# Patient Record
Sex: Female | Born: 1980 | Race: Black or African American | Hispanic: No | Marital: Single | Smoking: Current every day smoker
Health system: Southern US, Community
[De-identification: ages and names within clinical notes are randomized; demographics above are authoritative.]

## PROBLEM LIST (undated history)

## (undated) ENCOUNTER — Inpatient Hospital Stay (HOSPITAL_COMMUNITY): Payer: Self-pay

## (undated) DIAGNOSIS — R011 Cardiac murmur, unspecified: Secondary | ICD-10-CM

---

## 2009-08-08 ENCOUNTER — Emergency Department (HOSPITAL_COMMUNITY): Admission: EM | Admit: 2009-08-08 | Discharge: 2009-08-09 | Payer: Self-pay | Admitting: Emergency Medicine

## 2011-10-10 ENCOUNTER — Encounter (HOSPITAL_COMMUNITY): Payer: Self-pay

## 2011-10-10 ENCOUNTER — Inpatient Hospital Stay (HOSPITAL_COMMUNITY)
Admission: AD | Admit: 2011-10-10 | Discharge: 2011-10-10 | Disposition: A | Discharge status: Home / Self Care | Payer: Medicaid Other | Source: Ambulatory Visit | Attending: Obstetrics & Gynecology | Admitting: Obstetrics & Gynecology

## 2011-10-10 ENCOUNTER — Inpatient Hospital Stay (HOSPITAL_COMMUNITY): Payer: Medicaid Other

## 2011-10-10 DIAGNOSIS — O209 Hemorrhage in early pregnancy, unspecified: Secondary | ICD-10-CM

## 2011-10-10 LAB — CBC
HCT: 36.9 % (ref 36.0–46.0)
MCH: 27.9 pg (ref 26.0–34.0)
MCHC: 33.9 g/dL (ref 30.0–36.0)
MCV: 82.4 fL (ref 78.0–100.0)
Platelets: 212 10*3/uL (ref 150–400)
RDW: 12.4 % (ref 11.5–15.5)
WBC: 4.8 10*3/uL (ref 4.0–10.5)

## 2011-10-10 LAB — URINALYSIS, ROUTINE W REFLEX MICROSCOPIC
Bilirubin Urine: NEGATIVE
Nitrite: NEGATIVE
Specific Gravity, Urine: 1.02 (ref 1.005–1.030)
Urobilinogen, UA: 0.2 mg/dL (ref 0.0–1.0)
pH: 7 (ref 5.0–8.0)

## 2011-10-10 LAB — WET PREP, GENITAL
Clue Cells Wet Prep HPF POC: NONE SEEN
Trich, Wet Prep: NONE SEEN

## 2011-10-10 LAB — URINE MICROSCOPIC-ADD ON

## 2011-10-10 LAB — POCT PREGNANCY, URINE: Preg Test, Ur: POSITIVE — AB

## 2011-10-10 NOTE — MAU Provider Note (Signed)
History     CSN: [id number]  Arrival date and time: 10/10/11 1058   None     Chief Complaint  Patient presents with  . Vaginal Bleeding   HPI 31 y.o. G1P0 at [redacted]w[redacted]d by LMP. + UPT at [facility] last week. Spotting x 2 days, heavier bleeding today, + cramping today. Last intercourse 4 or 5 days ago.    Past Medical History  Diagnosis Date  . No pertinent past medical history     No past surgical history on file.  Family History  Problem Relation Age of Onset  . Hypertension Mother   . Hyperlipidemia Father   . Hypertension Father     History  Substance Use Topics  . Smoking status: Current Everyday Smoker -- 1 years  . Smokeless tobacco: Not on file  . Alcohol Use: No     former use    Allergies: No Known Allergies  Prescriptions prior to admission  Medication Sig Dispense Refill  . ibuprofen (ADVIL,MOTRIN) 200 MG tablet Take 200 mg by mouth every 6 (six) hours as needed.      . Prenatal Vit-Fe Fumarate-FA (PRENATAL MULTIVITAMIN) TABS Take 1 tablet by mouth daily.        Review of Systems  Constitutional: Negative.   Respiratory: Negative.   Cardiovascular: Negative.   Gastrointestinal: Negative for nausea, vomiting, abdominal pain, diarrhea and constipation.  Genitourinary: Negative for dysuria, urgency, frequency, hematuria and flank pain.       Positive for vaginal bleeding and cramping  Musculoskeletal: Negative.   Neurological: Negative.   Psychiatric/Behavioral: Negative.    Physical Exam   Blood pressure 116/66, pulse 60, temperature 97.8 F (36.6 C), temperature source Oral, resp. rate 18, height 5\' 7"  (1.702 m), weight 137 lb 9.6 oz (62.415 kg), last menstrual period 07/13/2011.  Physical Exam  Nursing note and vitals reviewed. Constitutional: She is oriented to person, place, and time. She appears well-developed and well-nourished. No distress.  Cardiovascular: Normal rate.   Respiratory: Effort normal. No respiratory distress.  GI: Soft. She  exhibits no distension and no mass. There is no tenderness. There is no rebound and no guarding.  Genitourinary: There is no tenderness, lesion or injury on the right labia. There is no tenderness, lesion or injury on the left labia. Uterus is not tender. Cervix exhibits no motion tenderness, no discharge and no friability. Right adnexum displays no mass, no tenderness and no fullness. Left adnexum displays no mass, no tenderness and no fullness. There is bleeding (small) around the vagina. No erythema around the vagina. No vaginal discharge found.  Musculoskeletal: Normal range of motion.  Neurological: She is alert and oriented to person, place, and time.  Skin: Skin is warm and dry.  Psychiatric: She has a normal mood and affect.    MAU Course  Procedures  Results for orders placed during the hospital encounter of 10/10/11 (from the past 24 hour(s))  URINALYSIS, ROUTINE W REFLEX MICROSCOPIC     Status: Abnormal   Collection Time   10/10/11 11:26 AM      Component Value Range   Color, Urine YELLOW  YELLOW    APPearance HAZY (*) CLEAR    Specific Gravity, Urine 1.020  1.005 - 1.030    pH 7.0  5.0 - 8.0    Glucose, UA NEGATIVE  NEGATIVE (mg/dL)   Hgb urine dipstick LARGE (*) NEGATIVE    Bilirubin Urine NEGATIVE  NEGATIVE    Ketones, ur NEGATIVE  NEGATIVE (mg/dL)   Protein, ur  NEGATIVE  NEGATIVE (mg/dL)   Urobilinogen, UA 0.2  0.0 - 1.0 (mg/dL)   Nitrite NEGATIVE  NEGATIVE    Leukocytes, UA NEGATIVE  NEGATIVE   URINE MICROSCOPIC-ADD ON     Status: Abnormal   Collection Time   10/10/11 11:26 AM      Component Value Range   Squamous Epithelial / LPF FEW (*) RARE    WBC, UA 0-2  <3 (WBC/hpf)   RBC / HPF 21-50  <3 (RBC/hpf)   Bacteria, UA RARE  RARE   POCT PREGNANCY, URINE     Status: Abnormal   Collection Time   10/10/11 11:33 AM      Component Value Range   Preg Test, Ur POSITIVE (*) NEGATIVE   WET PREP, GENITAL     Status: Abnormal   Collection Time   10/10/11 12:20 PM       Component Value Range   Yeast Wet Prep HPF POC NONE SEEN  NONE SEEN    Trich, Wet Prep NONE SEEN  NONE SEEN    Clue Cells Wet Prep HPF POC NONE SEEN  NONE SEEN    WBC, Wet Prep HPF POC FEW (*) NONE SEEN   CBC     Status: Normal   Collection Time   10/10/11 12:41 PM      Component Value Range   WBC 4.8  4.0 - 10.5 (K/uL)   RBC 4.48  3.87 - 5.11 (MIL/uL)   Hemoglobin 12.5  12.0 - 15.0 (g/dL)   HCT 16.1  09.6 - 04.5 (%)   MCV 82.4  78.0 - 100.0 (fL)   MCH 27.9  26.0 - 34.0 (pg)   MCHC 33.9  30.0 - 36.0 (g/dL)   RDW 40.9  81.1 - 91.4 (%)   Platelets 212  150 - 400 (K/uL)  ABO/RH     Status: Normal   Collection Time   10/10/11 12:41 PM      Component Value Range   ABO/RH(D) B POS    HCG, QUANTITATIVE, PREGNANCY     Status: Abnormal   Collection Time   10/10/11 12:41 PM      Component Value Range   hCG, Beta Chain, Quant, S 8757 (*) <5 (mIU/mL)   US Ob Comp Less 14 Wks  10/10/2011  *RADIOLOGY REPORT*  Clinical Data: Abdominal pain and bleeding and estimated gestational age by last menstrual period equals 12 weeks 5 days.  OBSTETRIC <14 WK ULTRASOUND  Technique:  Transabdominal ultrasound was performed for evaluation of the gestation as well as the maternal uterus and adnexal regions.  Comparison:  None.  Intrauterine gestational sac: Single elongated gestational sac. Yolk sac: Non identified Embryo: Not identified  MSD: 14.5 mm  sixw  2d  Maternal uterus/Adnexae: Right ovary is normal.  The simple cyst within the left ovary measuring 4.3 cm.  The cyst is anechoic and thin-walled.  There multiple fibroids within the uterus which are intramural.  The largest measures 2 cm.  There is trace amount of free fluid.  There is a large subchorionic hemorrhage.  IMPRESSION:  1.  Single elongated intrauterine gestational sac without a yolk sac or embryo.  Differential includes early intrauterine gestation, pseudo sac of ectopic pregnancy, or spontaneous abortion in progress.  2. Estimated gestational age by  mean sac diameter equals 6 weeks 2 days. Of note this is discordant with last menstrual period.  3.  Large subchorionic hemorrhage. 4.  Trace free fluid. 5.  Functional ovarian cysts within the left ovary and  uterine fibroids.  Original Report Authenticated By: Genevive Bi, M.D.   US Ob Transvaginal  10/10/2011  *RADIOLOGY REPORT*  Clinical Data: Abdominal pain and bleeding and estimated gestational age by last menstrual period equals 12 weeks 5 days.  OBSTETRIC <14 WK ULTRASOUND  Technique:  Transabdominal ultrasound was performed for evaluation of the gestation as well as the maternal uterus and adnexal regions.  Comparison:  None.  Intrauterine gestational sac: Single elongated gestational sac. Yolk sac: Non identified Embryo: Not identified  MSD: 14.5 mm  sixw  2d  Maternal uterus/Adnexae: Right ovary is normal.  The simple cyst within the left ovary measuring 4.3 cm.  The cyst is anechoic and thin-walled.  There multiple fibroids within the uterus which are intramural.  The largest measures 2 cm.  There is trace amount of free fluid.  There is a large subchorionic hemorrhage.  IMPRESSION:  1.  Single elongated intrauterine gestational sac without a yolk sac or embryo.  Differential includes early intrauterine gestation, pseudo sac of ectopic pregnancy, or spontaneous abortion in progress.  2. Estimated gestational age by mean sac diameter equals 6 weeks 2 days. Of note this is discordant with last menstrual period.  3.  Large subchorionic hemorrhage. 4.  Trace free fluid. 5.  Functional ovarian cysts within the left ovary and uterine fibroids.  Original Report Authenticated By: Genevive Bi, M.D.    Assessment and Plan  31 y.o. G1P0 with bleeding in early pregnancy F/U 48 hours for repeat quant Rev'd precautions  Richie Bonanno 10/10/2011, 1:42 PM

## 2011-10-10 NOTE — MAU Note (Signed)
Patient has small amount of bright red blood on pad.

## 2011-10-10 NOTE — MAU Note (Signed)
Pt reports having spotting x 2 days and woke up this morning a vaginal bleeding heavier with cramping.

## 2011-10-11 ENCOUNTER — Encounter (HOSPITAL_COMMUNITY): Payer: Self-pay | Admitting: *Deleted

## 2011-10-11 ENCOUNTER — Inpatient Hospital Stay (HOSPITAL_COMMUNITY)
Admission: AD | Admit: 2011-10-11 | Discharge: 2011-10-11 | Disposition: A | Discharge status: Home / Self Care | Payer: Medicaid Other | Source: Ambulatory Visit | Attending: Obstetrics & Gynecology | Admitting: Obstetrics & Gynecology

## 2011-10-11 DIAGNOSIS — O039 Complete or unspecified spontaneous abortion without complication: Secondary | ICD-10-CM | POA: Insufficient documentation

## 2011-10-11 HISTORY — DX: Cardiac murmur, unspecified: R01.1

## 2011-10-11 LAB — CBC
Hemoglobin: 12.9 g/dL (ref 12.0–15.0)
MCH: 27.9 pg (ref 26.0–34.0)
Platelets: 232 10*3/uL (ref 150–400)
RBC: 4.62 MIL/uL (ref 3.87–5.11)
WBC: 6.4 10*3/uL (ref 4.0–10.5)

## 2011-10-11 LAB — HCG, QUANTITATIVE, PREGNANCY: hCG, Beta Chain, Quant, S: 2586 m[IU]/mL — ABNORMAL HIGH (ref <5)

## 2011-10-11 NOTE — MAU Provider Note (Signed)
History     CSN: [id number]  Arrival date and time: 10/11/11 1205   First Provider Initiated Contact with Patient 10/11/11 1314      Chief Complaint  Patient presents with  . Abdominal Cramping   HPI 31 y.o. G1P0000 here with vaginal bleeding, passed clot and tissue this morning. Mild pain. Seen in MAU yesterday, 12 weeks by LMP, u/s showed irregular 6+ week size gestational sac with no yolk sac or fetal pole.    Past Medical History  Diagnosis Date  . Heart murmur     at birth    Past Surgical History  Procedure Date  . No past surgeries     Family History  Problem Relation Age of Onset  . Hypertension Mother   . Hyperlipidemia Father   . Hypertension Father   . Anesthesia problems Neg Hx     History  Substance Use Topics  . Smoking status: Current Everyday Smoker -- 1 years    Types: Cigarettes  . Smokeless tobacco: Never Used   Comment: went from pack a day, to 1 a day with + preg  . Alcohol Use: No     former use    Allergies: No Known Allergies  No prescriptions prior to admission    Review of Systems  Constitutional: Negative.   Respiratory: Negative.   Cardiovascular: Negative.   Gastrointestinal: Negative for nausea, vomiting, abdominal pain, diarrhea and constipation.  Genitourinary: Negative for dysuria, urgency, frequency, hematuria and flank pain.       Positive for vaginal bleeding, mild cramping  Musculoskeletal: Negative.   Neurological: Negative.   Psychiatric/Behavioral: Negative.    Physical Exam   Blood pressure 128/72, pulse 69, temperature 97.5 F (36.4 C), temperature source Oral, resp. rate 16, height 5\' 6"  (1.676 m), weight 139 lb 12.8 oz (63.413 kg), last menstrual period 07/13/2011, SpO2 100.00%.  Physical Exam  Nursing note and vitals reviewed. Constitutional: She is oriented to person, place, and time. She appears well-developed and well-nourished. No distress.  Cardiovascular: Normal rate.   Respiratory: Effort  normal.  Genitourinary: There is bleeding (small) around the vagina.       Cervix slightly open   Musculoskeletal: Normal range of motion.  Neurological: She is alert and oriented to person, place, and time.  Skin: Skin is warm and dry.  Psychiatric: She has a normal mood and affect.   Pt presented to MAU with apparent POC on pad, sent to pathology  MAU Course  Procedures Results for orders placed during the hospital encounter of 10/11/11 (from the past 72 hour(s))  CBC     Status: Normal   Collection Time   10/11/11  1:00 PM      Component Value Range Comment   WBC 6.4  4.0 - 10.5 (K/uL)    RBC 4.62  3.87 - 5.11 (MIL/uL)    Hemoglobin 12.9  12.0 - 15.0 (g/dL)    HCT 40.9  81.1 - 91.4 (%)    MCV 82.9  78.0 - 100.0 (fL)    MCH 27.9  26.0 - 34.0 (pg)    MCHC 33.7  30.0 - 36.0 (g/dL)    RDW 78.2  95.6 - 21.3 (%)    Platelets 232  150 - 400 (K/uL)   HCG, QUANTITATIVE, PREGNANCY     Status: Abnormal   Collection Time   10/11/11  1:00 PM      Component Value Range Comment   hCG, Beta Chain, Quant, S 2586 (*) <5 (mIU/mL)  Quant decreased from 8700 yesterday.    Assessment and Plan  31 y.o. G1P0000 with 6 week SAB Precautions rev'd F/U in clinic in 2 weeks for repeat quant  Shela Esses 10/11/2011, 2:00 PM

## 2011-10-11 NOTE — MAU Note (Signed)
Was to return tomorrow.  When passed a clot with tissue thought she should just come back.

## 2011-10-11 NOTE — MAU Note (Signed)
Patient states she was seen in MAU yesterday. Passed a moderate size clot with some "tissue" last night. Continues to have a little brown to bright red bleeding with wiping. Has lower abdominal cramping.

## 2011-10-25 ENCOUNTER — Other Ambulatory Visit: Payer: Self-pay

## 2011-10-25 DIAGNOSIS — O039 Complete or unspecified spontaneous abortion without complication: Secondary | ICD-10-CM

## 2011-10-25 LAB — HCG, QUANTITATIVE, PREGNANCY: hCG, Beta Chain, Quant, S: 15.6 m[IU]/mL

## 2012-01-19 ENCOUNTER — Encounter (HOSPITAL_COMMUNITY): Payer: Self-pay | Admitting: *Deleted

## 2012-01-19 ENCOUNTER — Inpatient Hospital Stay (HOSPITAL_COMMUNITY): Payer: Medicaid Other

## 2012-01-19 ENCOUNTER — Inpatient Hospital Stay (HOSPITAL_COMMUNITY)
Admission: AD | Admit: 2012-01-19 | Discharge: 2012-01-19 | Disposition: A | Discharge status: Home / Self Care | Payer: Medicaid Other | Source: Ambulatory Visit | Attending: Obstetrics & Gynecology | Admitting: Obstetrics & Gynecology

## 2012-01-19 DIAGNOSIS — O210 Mild hyperemesis gravidarum: Secondary | ICD-10-CM | POA: Insufficient documentation

## 2012-01-19 DIAGNOSIS — Z3490 Encounter for supervision of normal pregnancy, unspecified, unspecified trimester: Secondary | ICD-10-CM

## 2012-01-19 DIAGNOSIS — R197 Diarrhea, unspecified: Secondary | ICD-10-CM | POA: Insufficient documentation

## 2012-01-19 DIAGNOSIS — R109 Unspecified abdominal pain: Secondary | ICD-10-CM | POA: Insufficient documentation

## 2012-01-19 DIAGNOSIS — O219 Vomiting of pregnancy, unspecified: Secondary | ICD-10-CM

## 2012-01-19 DIAGNOSIS — N83209 Unspecified ovarian cyst, unspecified side: Secondary | ICD-10-CM | POA: Insufficient documentation

## 2012-01-19 DIAGNOSIS — O209 Hemorrhage in early pregnancy, unspecified: Secondary | ICD-10-CM | POA: Insufficient documentation

## 2012-01-19 DIAGNOSIS — O34599 Maternal care for other abnormalities of gravid uterus, unspecified trimester: Secondary | ICD-10-CM | POA: Insufficient documentation

## 2012-01-19 LAB — URINALYSIS, ROUTINE W REFLEX MICROSCOPIC
Hgb urine dipstick: NEGATIVE
Nitrite: NEGATIVE
Protein, ur: NEGATIVE mg/dL
Specific Gravity, Urine: 1.025 (ref 1.005–1.030)
Urobilinogen, UA: 0.2 mg/dL (ref 0.0–1.0)

## 2012-01-19 LAB — HCG, QUANTITATIVE, PREGNANCY: hCG, Beta Chain, Quant, S: 81099 m[IU]/mL — ABNORMAL HIGH (ref <5)

## 2012-01-19 LAB — CBC
HCT: 36.4 % (ref 36.0–46.0)
Hemoglobin: 12.7 g/dL (ref 12.0–15.0)
RBC: 4.52 MIL/uL (ref 3.87–5.11)
WBC: 6.1 10*3/uL (ref 4.0–10.5)

## 2012-01-19 LAB — POCT PREGNANCY, URINE: Preg Test, Ur: POSITIVE — AB

## 2012-01-19 LAB — WET PREP, GENITAL
Clue Cells Wet Prep HPF POC: NONE SEEN
Trich, Wet Prep: NONE SEEN

## 2012-01-19 NOTE — MAU Provider Note (Signed)
History     CSN: [id number]  Arrival date and time: 01/19/12 1536   First Provider Initiated Contact with Patient 01/19/12 1626      Chief Complaint  Patient presents with  . Abdominal Pain  . Nausea  . Possible Pregnancy   HPI  Laurie [name] 31 y.o. [redacted]w[redacted]d presents today for +N/V/D since Sunday.  States some recent vaginal discharge.  No odor.  No bleeding.  Denies fever or cough.  Has tried tums and MOM with no relief.  OB History    Grav Para Term Preterm Abortions TAB SAB Ect Mult Living   2 0 0 0 1 0 1 0 0 0       Past Medical History  Diagnosis Date  . Heart murmur     at birth    Past Surgical History  Procedure Date  . No past surgeries     Family History  Problem Relation Age of Onset  . Hypertension Mother   . Hyperlipidemia Father   . Hypertension Father   . Anesthesia problems Neg Hx     History  Substance Use Topics  . Smoking status: Current Everyday Smoker -- 1 years    Types: Cigarettes  . Smokeless tobacco: Never Used   Comment: went from pack a day, to 1 a day with + preg  . Alcohol Use: No     former use    Allergies: No Known Allergies  Prescriptions prior to admission  Medication Sig Dispense Refill  . ibuprofen (ADVIL,MOTRIN) 200 MG tablet Take 400-600 mg by mouth every 6 (six) hours as needed. For pain        Review of Systems  Constitutional: Negative.   HENT: Negative.   Eyes: Negative.   Respiratory: Negative.   Cardiovascular: Negative.   Gastrointestinal: Positive for nausea, vomiting, abdominal pain and diarrhea.       See HPI.  Genitourinary: Negative for dysuria, urgency and frequency.  Musculoskeletal: Negative.   Skin: Negative.   Neurological: Negative.   Endo/Heme/Allergies: Negative.   Psychiatric/Behavioral: Negative.    Physical Exam   Blood pressure 119/80, pulse 63, temperature 98.8 F (37.1 C), temperature source Oral, resp. rate 20, height 5\' 6"  (1.676 m), weight 59.421 kg (131 lb), last  menstrual period 07/13/2011, not currently breastfeeding.  Physical Exam  Constitutional: She is oriented to person, place, and time. She appears well-developed and well-nourished.  HENT:  Head: Normocephalic and atraumatic.  Neck: Normal range of motion. Neck supple.  Cardiovascular: Normal rate, regular rhythm and intact distal pulses.  Exam reveals no gallop.   Murmur heard.      S2 split with click heard.  Patient states has had a murmur since childhood.  Has had no problems with it.  Respiratory: Effort normal and breath sounds normal. No respiratory distress.  GI: Soft. Bowel sounds are normal.  Genitourinary: Uterus normal. Vaginal discharge found.       See below.  Musculoskeletal: Normal range of motion.  Neurological: She is alert and oriented to person, place, and time.  Skin: Skin is warm and dry.  Psychiatric: She has a normal mood and affect. Her behavior is normal.   Speculum exam: Vagina - Small amount of creamy discharge, no odor. Cervix - No contact bleeding. Bimanual exam: Cervix closed. No CMT. Uterus non tender, normal size for dates. Adnexa non tender, no masses bilaterally. GC/Chlam, wet prep done. Chaperone present for exam.     MAU Course  Procedures  MDM Results for orders  placed during the hospital encounter of 01/19/12 (from the past 24 hour(s))  URINALYSIS, ROUTINE W REFLEX MICROSCOPIC     Status: Abnormal   Collection Time   01/19/12  4:00 PM      Component Value Range   Color, Urine YELLOW  YELLOW   APPearance HAZY (*) CLEAR   Specific Gravity, Urine 1.025  1.005 - 1.030   pH 6.0  5.0 - 8.0   Glucose, UA NEGATIVE  NEGATIVE mg/dL   Hgb urine dipstick NEGATIVE  NEGATIVE   Bilirubin Urine NEGATIVE  NEGATIVE   Ketones, ur 15 (*) NEGATIVE mg/dL   Protein, ur NEGATIVE  NEGATIVE mg/dL   Urobilinogen, UA 0.2  0.0 - 1.0 mg/dL   Nitrite NEGATIVE  NEGATIVE   Leukocytes, UA NEGATIVE  NEGATIVE  POCT PREGNANCY, URINE     Status: Abnormal    Collection Time   01/19/12  4:11 PM      Component Value Range   Preg Test, Ur POSITIVE (*) NEGATIVE  CBC     Status: Normal   Collection Time   01/19/12  4:45 PM      Component Value Range   WBC 6.1  4.0 - 10.5 K/uL   RBC 4.52  3.87 - 5.11 MIL/uL   Hemoglobin 12.7  12.0 - 15.0 g/dL   HCT 40.9  81.1 - 91.4 %   MCV 80.5  78.0 - 100.0 fL   MCH 28.1  26.0 - 34.0 pg   MCHC 34.9  30.0 - 36.0 g/dL   RDW 78.2  95.6 - 21.3 %   Platelets 212  150 - 400 K/uL  HCG, QUANTITATIVE, PREGNANCY     Status: Abnormal   Collection Time   01/19/12  4:45 PM      Component Value Range   hCG, Beta Chain, Quant, S 81099 (*) <5 mIU/mL  WET PREP, GENITAL     Status: Abnormal   Collection Time   01/19/12  5:49 PM      Component Value Range   Yeast Wet Prep HPF POC NONE SEEN  NONE SEEN   Trich, Wet Prep NONE SEEN  NONE SEEN   Clue Cells Wet Prep HPF POC NONE SEEN  NONE SEEN   WBC, Wet Prep HPF POC MODERATE (*) NONE SEEN   *RADIOLOGY REPORT*  Clinical Data: Positive urine pregnancy test. Prior miscarriage  several months ago.  OBSTETRIC <14 WK [place] AND TRANSVAGINAL OB US  Technique: Both transabdominal and transvaginal ultrasound  examinations were performed for complete evaluation of the  gestation as well as the maternal uterus, adnexal regions, and  pelvic cul-de-sac.  Findings: Intrauterine gestational sac noted with visualized embryo  and yolk sac. Embryonic cardiac activity is present at 115 beats  per minute, and crown-rump length is 0.7 cm compatible with 6 weeks  4 days gestation.  A small amount of subchorionic hemorrhage is visible.  Right ovary measures the right ovary appears normal. Left ovary  measures 4.8 x 4.4 x 4.4 cm and contains a 3.7 x 4.0 x 4.1 cm  mildly complex cyst with slightly irregular margins. Trace free  pelvic fluid observed.  IMPRESSION:  1. Single living anterior pregnancy measuring at 6 weeks forties  gestation. Small amount of subchorionic hemorrhage.  2. 4 cm cyst  in the left ovary, slightly complex. Trace amount of  maternal free pelvic fluid.  Original Report Authenticated By: Dellia Cloud, M.D.      Patient is B+.    Assessment and Plan  Assessment:  Pregnancy L Ovarian Cyst Small Subchorionic Hemorrhage  Plan:  Your pregnancy test is positive.  No smoking, no drugs, no alcohol.  Take a prenatal vitamin one by mouth every day.  Eat small frequent snacks to avoid nausea.  Begin prenatal care as soon as possible.  Contact the 1-800-QUITNOW line for assistance with quitting smoking.  You may try 25mg  of vitamin B6 three times daily for nausea.  Servando Salina 01/19/2012, 6:05 PM

## 2012-01-19 NOTE — MAU Note (Signed)
Miscarriage in March, think I might be preg again. No period in May. Since Sat, been real nauseated, cramping in abd.

## 2012-01-19 NOTE — Discharge Instructions (Signed)
Your pregnancy test is positive.  No smoking, no drugs, no alcohol.  Take a prenatal vitamin one by mouth every day.  Eat small frequent snacks to avoid nausea.  Begin prenatal care as soon as possible.  Contact 1-800-QUITNOW for assistance quitting smoking.  You may try 25mg  of vitamin B6 three times daily for nausea.     ABCs of Pregnancy A Antepartum care is very important. Be sure you see your doctor and get prenatal care as soon as you think you are pregnant. At this time, you will be tested for infection, genetic abnormalities and potential problems with you and the pregnancy. This is the time to discuss diet, exercise, work, medications, labor, pain medication during labor and the possibility of a cesarean delivery. Ask any questions that may concern you. It is important to see your doctor regularly throughout your pregnancy. Avoid exposure to toxic substances and chemicals - such as cleaning solvents, lead and mercury, some insecticides, and paint. Pregnant women should avoid exposure to paint fumes, and fumes that cause you to feel ill, dizzy or faint. When possible, it is a good idea to have a pre-pregnancy consultation with your caregiver to begin some important recommendations your caregiver suggests such as, taking folic acid, exercising, quitting smoking, avoiding alcoholic beverages, etc. B Breastfeeding is the healthiest choice for both you and your baby. It has many nutritional benefits for the baby and health benefits for the mother. It also creates a very tight and loving bond between the baby and mother. Talk to your doctor, your family and friends, and your employer about how you choose to feed your baby and how they can support you in your decision. Not all birth defects can be prevented, but a woman can take actions that may increase her chance of having a healthy baby. Many birth defects happen very early in pregnancy, sometimes before a woman even knows she is pregnant. Birth defects  or abnormalities of any child in your or the father's family should be discussed with your caregiver. Get a good support bra as your breast size changes. Wear it especially when you exercise and when nursing.  C Celebrate the news of your pregnancy with the your spouse/father and family. Childbirth classes are helpful to take for you and the spouse/father because it helps to understand what happens during the pregnancy, labor and delivery. Cesarean delivery should be discussed with your doctor so you are prepared for that possibility. The pros and cons of circumcision if it is a boy, should be discussed with your pediatrician. Cigarette smoking during pregnancy can result in low birth weight babies. It has been associated with infertility, miscarriages, tubal pregnancies, infant death (mortality) and poor health (morbidity) in childhood. Additionally, cigarette smoking may cause long-term learning disabilities. If you smoke, you should try to quit before getting pregnant and not smoke during the pregnancy. Secondary smoke may also harm a mother and her developing baby. It is a good idea to ask people to stop smoking around you during your pregnancy and after the baby is born. Extra calcium is necessary when you are pregnant and is found in your prenatal vitamin, in dairy products, green leafy vegetables and in calcium supplements. D A healthy diet according to your current weight and height, along with vitamins and mineral supplements should be discussed with your caregiver. Domestic abuse or violence should be made known to your doctor right away to get the situation corrected. Drink more water when you exercise to keep hydrated.  Discomfort of your back and legs usually develops and progresses from the middle of the second trimester through to delivery of the baby. This is because of the enlarging baby and uterus, which may also affect your balance. Do not take illegal drugs. Illegal drugs can seriously harm  the baby and you. Drink extra fluids (water is best) throughout pregnancy to help your body keep up with the increases in your blood volume. Drink at least 6 to 8 glasses of water, fruit juice, or milk each day. A good way to know you are drinking enough fluid is when your urine looks almost like clear water or is very light yellow.  E Eat healthy to get the nutrients you and your unborn baby need. Your meals should include the five basic food groups. Exercise (30 minutes of light to moderate exercise a day) is important and encouraged during pregnancy, if there are no medical problems or problems with the pregnancy. Exercise that causes discomfort or dizziness should be stopped and reported to your caregiver. Emotions during pregnancy can change from being ecstatic to depression and should be understood by you, your partner and your family. F Fetal screening with ultrasound, amniocentesis and monitoring during pregnancy and labor is common and sometimes necessary. Take 400 micrograms of folic acid daily both before, when possible, and during the first few months of pregnancy to reduce the risk of birth defects of the brain and spine. All women who could possibly become pregnant should take a vitamin with folic acid, every day. It is also important to eat a healthy diet with fortified foods (enriched grain products, including cereals, rice, breads, and pastas) and foods with natural sources of folate (orange juice, green leafy vegetables, beans, peanuts, broccoli, asparagus, peas, and lentils). The father should be involved with all aspects of the pregnancy including, the prenatal care, childbirth classes, labor, delivery, and postpartum time. Fathers may also have emotional concerns about being a father, financial needs, and raising a family. G Genetic testing should be done appropriately. It is important to know your family and the father's history. If there have been problems with pregnancies or birth  defects in your family, report these to your doctor. Also, genetic counselors can talk with you about the information you might need in making decisions about having a family. You can call a major medical center in your area for help in finding a board-certified genetic counselor. Genetic testing and counseling should be done before pregnancy when possible, especially if there is a history of problems in the mother's or father's family. Certain ethnic backgrounds are more at risk for genetic defects. H Get familiar with the hospital where you will be having your baby. Get to know how long it takes to get there, the labor and delivery area, and the hospital procedures. Be sure your medical insurance is accepted there. Get your home ready for the baby including, clothes, the baby's room (when possible), furniture and car seat. Hand washing is important throughout the day, especially after handling raw meat and poultry, changing the baby's diaper or using the bathroom. This can help prevent the spread of many bacteria and viruses that cause infection. Your hair may become dry and thinner, but will return to normal a few weeks after the baby is born. Heartburn is a common problem that can be treated by taking antacids recommended by your caregiver, eating smaller meals 5 or 6 times a day, not drinking liquids when eating, drinking between meals and raising the  head of your bed 2 to 3 inches. I Insurance to cover you, the baby, doctor and hospital should be reviewed so that you will be prepared to pay any costs not covered by your insurance plan. If you do not have medical insurance, there are usually clinics and services available for you in your community. Take 30 milligrams of iron during your pregnancy as prescribed by your doctor to reduce the risk of low red blood cells (anemia) later in pregnancy. All women of childbearing age should eat a diet rich in iron. J There should be a joint effort for the mother,  father and any other children to adapt to the pregnancy financially, emotionally, and psychologically during the pregnancy. Join a support group for moms-to-be. Or, join a class on parenting or childbirth. Have the family participate when possible. K Know your limits. Let your caregiver know if you experience any of the following:   Pain of any kind.   Strong cramps.   You develop a lot of weight in a short period of time (5 pounds in 3 to 5 days).   Vaginal bleeding, leaking of amniotic fluid.   Headache, vision problems.   Dizziness, fainting, shortness of breath.   Chest pain.   Fever of 102 F (38.9 C) or higher.   Gush of clear fluid from your vagina.   Painful urination.   Domestic violence.   Irregular heartbeat (palpitations).   Rapid beating of the heart (tachycardia).   Constant feeling sick to your stomach (nauseous) and vomiting.   Trouble walking, fluid retention (edema).   Muscle weakness.   If your baby has decreased activity.   Persistent diarrhea.   Abnormal vaginal discharge.   Uterine contractions at 20-minute intervals.   Back pain that travels down your leg.  L Learn and practice that what you eat and drink should be in moderation and healthy for you and your baby. Legal drugs such as alcohol and caffeine are important issues for pregnant women. There is no safe amount of alcohol a woman can drink while pregnant. Fetal alcohol syndrome, a disorder characterized by growth retardation, facial abnormalities, and central nervous system dysfunction, is caused by a woman's use of alcohol during pregnancy. Caffeine, found in tea, coffee, soft drinks and chocolate, should also be limited. Be sure to read labels when trying to cut down on caffeine during pregnancy. More than 200 foods, beverages, and over-the-counter medications contain caffeine and have a high salt content! There are coffees and teas that do not contain caffeine. M Medical conditions such  as diabetes, epilepsy, and high blood pressure should be treated and kept under control before pregnancy when possible, but especially during pregnancy. Ask your caregiver about any medications that may need to be changed or adjusted during pregnancy. If you are currently taking any medications, ask your caregiver if it is safe to take them while you are pregnant or before getting pregnant when possible. Also, be sure to discuss any herbs or vitamins you are taking. They are medicines, too! Discuss with your doctor all medications, prescribed and over-the-counter, that you are taking. During your prenatal visit, discuss the medications your doctor may give you during labor and delivery. N Never be afraid to ask your doctor or caregiver questions about your health, the progress of the pregnancy, family problems, stressful situations, and recommendation for a pediatrician, if you do not have one. It is better to take all precautions and discuss any questions or concerns you may have during  your office visits. It is a good idea to write down your questions before you visit the doctor. O Over-the-counter cough and cold remedies may contain alcohol or other ingredients that should be avoided during pregnancy. Ask your caregiver about prescription, herbs or over-the-counter medications that you are taking or may consider taking while pregnant.  P Physical activity during pregnancy can benefit both you and your baby by lessening discomfort and fatigue, providing a sense of well-being, and increasing the likelihood of early recovery after delivery. Light to moderate exercise during pregnancy strengthens the belly (abdominal) and back muscles. This helps improve posture. Practicing yoga, walking, swimming, and cycling on a stationary bicycle are usually safe exercises for pregnant women. Avoid scuba diving, exercise at high altitudes (over 3000 feet), skiing, horseback riding, contact sports, etc. Always check with  your doctor before beginning any kind of exercise, especially during pregnancy and especially if you did not exercise before getting pregnant. Q Queasiness, stomach upset and morning sickness are common during pregnancy. Eating a couple of crackers or dry toast before getting out of bed. Foods that you normally love may make you feel sick to your stomach. You may need to substitute other nutritious foods. Eating 5 or 6 small meals a day instead of 3 large ones may make you feel better. Do not drink with your meals, drink between meals. Questions that you have should be written down and asked during your prenatal visits. R Read about and make plans to baby-proof your home. There are important tips for making your home a safer environment for your baby. Review the tips and make your home safer for you and your baby. Read food labels regarding calories, salt and fat content in the food. S Saunas, hot tubs, and steam rooms should be avoided while you are pregnant. Excessive high heat may be harmful during your pregnancy. Your caregiver will screen and examine you for sexually transmitted diseases and genetic disorders during your prenatal visits. Learn the signs of labor. Sexual relations while pregnant is safe unless there is a medical or pregnancy problem and your caregiver advises against it. T Traveling long distances should be avoided especially in the third trimester of your pregnancy. If you do have to travel out of state, be sure to take a copy of your medical records and medical insurance plan with you. You should not travel long distances without seeing your doctor first. Most airlines will not allow you to travel after 36 weeks of pregnancy. Toxoplasmosis is an infection caused by a parasite that can seriously harm an unborn baby. Avoid eating undercooked meat and handling cat litter. Be sure to wear gloves when gardening. Tingling of the hands and fingers is not unusual and is due to fluid retention.  This will go away after the baby is born. U Womb (uterus) size increases during the first trimester. Your kidneys will begin to function more efficiently. This may cause you to feel the need to urinate more often. You may also leak urine when sneezing, coughing or laughing. This is due to the growing uterus pressing against your bladder, which lies directly in front of and slightly under the uterus during the first few months of pregnancy. If you experience burning along with frequency of urination or bloody urine, be sure to tell your doctor. The size of your uterus in the third trimester may cause a problem with your balance. It is advisable to maintain good posture and avoid wearing high heels during this time. An  ultrasound of your baby may be necessary during your pregnancy and is safe for you and your baby. V Vaccinations are an important concern for pregnant women. Get needed vaccines before pregnancy. Center for Disease Control ([employer]) has clear guidelines for the use of vaccines during pregnancy. Review the list, be sure to discuss it with your doctor. Prenatal vitamins are helpful and healthy for you and the baby. Do not take extra vitamins except what is recommended. Taking too much of certain vitamins can cause overdose problems. Continuous vomiting should be reported to your caregiver. Varicose veins may appear especially if there is a family history of varicose veins. They should subside after the delivery of the baby. Support hose helps if there is leg discomfort. W Being overweight or underweight during pregnancy may cause problems. Try to get within 15 pounds of your ideal weight before pregnancy. Remember, pregnancy is not a time to be dieting! Do not stop eating or start skipping meals as your weight increases. Both you and your baby need the calories and nutrition you receive from a healthy diet. Be sure to consult with your doctor about your diet. There is a formula and diet plan  available depending on whether you are overweight or underweight. Your caregiver or nutritionist can help and advise you if necessary. X Avoid X-rays. If you must have dental work or diagnostic tests, tell your dentist or physician that you are pregnant so that extra care can be taken. X-rays should only be taken when the risks of not taking them outweigh the risk of taking them. If needed, only the minimum amount of radiation should be used. When X-rays are necessary, protective lead shields should be used to cover areas of the body that are not being X-rayed. Y Your baby loves you. Breastfeeding your baby creates a loving and very close bond between the two of you. Give your baby a healthy environment to live in while you are pregnant. Infants and children require constant care and guidance. Their health and safety should be carefully watched at all times. After the baby is born, rest or take a nap when the baby is sleeping. Z Get your ZZZs. Be sure to get plenty of rest. Resting on your side as often as possible, especially on your left side is advised. It provides the best circulation to your baby and helps reduce swelling. Try taking a nap for 30 to 45 minutes in the afternoon when possible. After the baby is born rest or take a nap when the baby is sleeping. Try elevating your feet for that amount of time when possible. It helps the circulation in your legs and helps reduce swelling.  Most information courtesy of the CDC. Document Released: 07/12/2005 Document Revised: 07/01/2011 Document Reviewed: 03/26/2009 [facility] Patient Information 2012 [place], [place].  Prenatal Care [facility]    [facility]  [phone]     Phone: [phone]          [facility]                      [facility]  @[place]     Phone: [phone]  Phone: [phone]         [facility]  Center     Phone: [phone]  Phone: [phone]           [facility] @  Kathryne Sharper                hone: [phone]  Phone: [phone]         [facility] [address]ise Ceo      Phone: [phone]  Phone: [phone]         [facility] Dept.                Phone: [phone]  Women's Health   Phone:[phone]    Family [address] [place])          Phone: [phone] [facility]   Phone: [phone]

## 2012-01-19 NOTE — MAU Provider Note (Signed)
Saw patient and agree with exam and documentation by C. Andrey Farmer, NP Student. Client was discharged and chart closed before actual note could be edited.

## 2012-01-20 LAB — GC/CHLAMYDIA PROBE AMP, GENITAL
Chlamydia, DNA Probe: NEGATIVE
GC Probe Amp, Genital: NEGATIVE

## 2012-02-21 LAB — OB RESULTS CONSOLE ABO/RH: RH Type: POSITIVE

## 2012-02-21 LAB — OB RESULTS CONSOLE ANTIBODY SCREEN: Antibody Screen: NEGATIVE

## 2012-02-21 LAB — OB RESULTS CONSOLE HIV ANTIBODY (ROUTINE TESTING): HIV: NONREACTIVE

## 2012-03-06 ENCOUNTER — Encounter (HOSPITAL_COMMUNITY): Payer: Self-pay | Admitting: Emergency Medicine

## 2012-03-06 ENCOUNTER — Emergency Department (HOSPITAL_COMMUNITY)
Admission: EM | Admit: 2012-03-06 | Discharge: 2012-03-06 | Disposition: A | Discharge status: Home / Self Care | Payer: Medicaid Other | Attending: Emergency Medicine | Admitting: Emergency Medicine

## 2012-03-06 DIAGNOSIS — F17200 Nicotine dependence, unspecified, uncomplicated: Secondary | ICD-10-CM | POA: Insufficient documentation

## 2012-03-06 DIAGNOSIS — K0381 Cracked tooth: Secondary | ICD-10-CM | POA: Insufficient documentation

## 2012-03-06 DIAGNOSIS — K0889 Other specified disorders of teeth and supporting structures: Secondary | ICD-10-CM

## 2012-03-06 DIAGNOSIS — H9209 Otalgia, unspecified ear: Secondary | ICD-10-CM | POA: Insufficient documentation

## 2012-03-06 NOTE — ED Notes (Signed)
Pt states she is [redacted] weeks pregnant and has been having wisdom teeth pain.  States she is taking antibiotics and Tylenol 3 per her OB-Gyn prior to extraction.  Reports increased pain that is now radiating to her L ear.

## 2012-03-06 NOTE — ED Notes (Signed)
Pt alert, NAD, calm, interactive, skin W&D, resps e/u, speaking in clear complete sentences, ambulatory, steady gait, declined dental block offered by [facility], "will tough it out until Thursday", out to d/c desk.

## 2012-03-06 NOTE — ED Provider Notes (Signed)
Medical screening examination/treatment/procedure(s) were performed by non-physician practitioner and as supervising physician I was immediately available for consultation/collaboration.  Sunnie Nielsen, MD 03/06/12 [phone]

## 2012-03-06 NOTE — ED Provider Notes (Signed)
History     CSN: [id number]  Arrival date & time 03/06/12  0037   First MD Initiated Contact with Patient 03/06/12 0229      Chief Complaint  Patient presents with  . Dental Pain  . Otalgia    (Consider location/radiation/quality/duration/timing/severity/associated sxs/prior treatment) HPI Comments: Patient that is currently [redacted] weeks pregnant presents emergency department with chief complaint of left wisdom tooth pain that radiates to her left ear.  She states that her symptoms began a couple of weeks ago and it is gradually worsening.  Patient was evaluated by a dentist at which time she was prescribed penicillin and Tylenol 3 for pain and referred to an oral surgeon.  Patient has an appointment on Thursday with the oral surgeon but became concerned because of the left otalgia.  She denies any fevers, night sweats, chills, nausea, vomiting, abdominal pain, headaches, change in vision, difficulty breathing, trismus, tinnitus, ear discharge difficulty swallowing or sore throat.  Patient is a 31 y.o. female presenting with tooth pain and ear pain. The history is provided by the patient.  Dental PainPrimary symptoms do not include headaches, fever, sore throat or cough.  Additional symptoms include: ear pain. Additional symptoms do not include: trouble swallowing and drooling.   Otalgia Pertinent negatives include no headaches, no rhinorrhea, no sore throat, no neck pain and no cough.    Past Medical History  Diagnosis Date  . Heart murmur     at birth    Past Surgical History  Procedure Date  . No past surgeries     Family History  Problem Relation Age of Onset  . Hypertension Mother   . Hyperlipidemia Father   . Hypertension Father   . Anesthesia problems Neg Hx     History  Substance Use Topics  . Smoking status: Current Everyday Smoker -- 1 years    Types: Cigarettes  . Smokeless tobacco: Never Used   Comment: went from pack a day, to 1 a day with + preg  . Alcohol  Use: No     former use    OB History    Grav Para Term Preterm Abortions TAB SAB Ect Mult Living   2 0 0 0 1 0 1 0 0 0       Review of Systems  Constitutional: Negative for fever, diaphoresis and activity change.  HENT: Positive for ear pain and dental problem. Negative for congestion, sore throat, rhinorrhea, sneezing, drooling, mouth sores, trouble swallowing, neck pain, voice change, postnasal drip, sinus pressure and tinnitus.   Respiratory: Negative for cough.   Genitourinary: Negative for dysuria.  Musculoskeletal: Negative for myalgias.  Skin: Negative for color change and wound.  Neurological: Negative for headaches.  All other systems reviewed and are negative.    Allergies  Review of patient's allergies indicates no known allergies.  Home Medications   Current Outpatient Rx  Name Route Sig Dispense Refill  . ACETAMINOPHEN 500 MG PO TABS Oral Take 1,000 mg by mouth every 6 (six) hours as needed. For pain    . ACETAMINOPHEN-CODEINE #3 300-30 MG PO TABS Oral Take 1-2 tablets by mouth every 4 (four) hours as needed.    Marland Kitchen CLINDAMYCIN HCL 300 MG PO CAPS Oral Take 300 mg by mouth 3 (three) times daily.    Marland Kitchen PRENATAL 27-0.8 MG PO TABS Oral Take 1 tablet by mouth daily.      BP 130/77  Pulse 72  Temp 97.9 F (36.6 C) (Oral)  Resp 14  SpO2 100%  LMP 07/13/2011  Physical Exam  Nursing note and vitals reviewed. Constitutional: She is oriented to person, place, and time. She appears well-developed and well-nourished. No distress.  HENT:  Head: Normocephalic and atraumatic. No trismus in the jaw.  Right Ear: Hearing, tympanic membrane and external ear normal.  Left Ear: Hearing, tympanic membrane, external ear and ear canal normal.  Mouth/Throat: Uvula is midline, oropharynx is clear and moist and mucous membranes are normal. Abnormal dentition. No dental abscesses or uvula swelling. No oropharyngeal exudate, posterior oropharyngeal edema, posterior oropharyngeal erythema  or tonsillar abscesses.          Pt able to open and close mouth with out difficulty. Airway intact. Uvula midline. Mild gingival swelling with tenderness over affected area, but no fluctuance. No swelling or tenderness of submental and submandibular regions.  Eyes: Conjunctivae and EOM are normal.  Neck: Normal range of motion and full passive range of motion without pain. Neck supple.  Cardiovascular: Normal rate and regular rhythm.   Pulmonary/Chest: Effort normal and breath sounds normal. No stridor. No respiratory distress. She has no wheezes.  Musculoskeletal: Normal range of motion.  Lymphadenopathy:       Head (right side): No submental, no submandibular, no tonsillar, no preauricular and no posterior auricular adenopathy present.       Head (left side): No submental, no submandibular, no tonsillar, no preauricular and no posterior auricular adenopathy present.    She has no cervical adenopathy.  Neurological: She is alert and oriented to person, place, and time.  Skin: Skin is warm and dry. No rash noted. She is not diaphoretic.    ED Course  Procedures (including critical care time)  Labs Reviewed - No data to display No results found.   No diagnosis found.    MDM  Dental pain w referred ear pain  Pt to ED w pain from cracked wisdom tooth already being followed by a dentist and orthodontist with Tylenol 3 & PCN. Advised pt to cont taking abx. Explained d/t to pregnancy that stronger arcotics are not recommended. Offered dental block which was refused by pt. Recommended pt to keep f-u apt already scheduled with her oral surgeon for Thursday. Pt agreeable w plan        Jaci Carrel, PA-C 03/06/12 0320

## 2012-08-01 ENCOUNTER — Ambulatory Visit (INDEPENDENT_AMBULATORY_CARE_PROVIDER_SITE_OTHER): Payer: Medicaid Other | Admitting: Pediatrics

## 2012-08-01 DIAGNOSIS — Z7681 Expectant parent(s) prebirth pediatrician visit: Secondary | ICD-10-CM | POA: Insufficient documentation

## 2012-08-09 NOTE — Progress Notes (Signed)
Prenatal counseling for impending newborn done  

## 2012-09-12 ENCOUNTER — Encounter (HOSPITAL_COMMUNITY): Payer: Self-pay | Admitting: *Deleted

## 2012-09-12 ENCOUNTER — Telehealth (HOSPITAL_COMMUNITY): Payer: Self-pay | Admitting: *Deleted

## 2012-09-12 NOTE — Telephone Encounter (Signed)
Preadmission screen  

## 2012-09-18 ENCOUNTER — Inpatient Hospital Stay (HOSPITAL_COMMUNITY)
Admission: RE | Admit: 2012-09-18 | Discharge: 2012-09-23 | DRG: 766 | Disposition: A | Discharge status: Home / Self Care | Payer: Medicaid Other | Source: Ambulatory Visit | Attending: Obstetrics & Gynecology | Admitting: Obstetrics & Gynecology

## 2012-09-18 ENCOUNTER — Encounter (HOSPITAL_COMMUNITY): Payer: Self-pay

## 2012-09-18 DIAGNOSIS — O99892 Other specified diseases and conditions complicating childbirth: Secondary | ICD-10-CM | POA: Diagnosis present

## 2012-09-18 DIAGNOSIS — O324XX0 Maternal care for high head at term, not applicable or unspecified: Secondary | ICD-10-CM | POA: Diagnosis present

## 2012-09-18 DIAGNOSIS — Z22330 Carrier of Group B streptococcus: Secondary | ICD-10-CM

## 2012-09-18 DIAGNOSIS — O480 Post-term pregnancy: Principal | ICD-10-CM | POA: Diagnosis present

## 2012-09-18 LAB — CBC
HCT: 38.3 % (ref 36.0–46.0)
MCH: 27.7 pg (ref 26.0–34.0)
MCV: 81 fL (ref 78.0–100.0)
Platelets: 163 10*3/uL (ref 150–400)
RDW: 14.3 % (ref 11.5–15.5)

## 2012-09-18 MED ORDER — MISOPROSTOL 25 MCG QUARTER TABLET
25.0000 ug | ORAL_TABLET | ORAL | Status: DC | PRN
  Administered 2012-09-18 – 2012-09-20 (×5): 25 ug via VAGINAL
  Filled 2012-09-18 (×5): qty 0.25

## 2012-09-18 MED ORDER — PENICILLIN G POTASSIUM 5000000 UNITS IJ SOLR
5.0000 10*6.[IU] | Freq: Once | INTRAMUSCULAR | Status: AC
  Administered 2012-09-18: 5 10*6.[IU] via INTRAVENOUS
  Filled 2012-09-18: qty 5

## 2012-09-18 MED ORDER — OXYCODONE-ACETAMINOPHEN 5-325 MG PO TABS: 1.0000 | ORAL_TABLET | ORAL | Status: DC | PRN

## 2012-09-18 MED ORDER — HYDROXYZINE HCL 50 MG/ML IM SOLN: 50.0000 mg | Freq: Four times a day (QID) | INTRAMUSCULAR | Status: DC | PRN

## 2012-09-18 MED ORDER — HYDROXYZINE HCL 50 MG PO TABS: 50.0000 mg | ORAL_TABLET | Freq: Four times a day (QID) | ORAL | Status: DC | PRN

## 2012-09-18 MED ORDER — TERBUTALINE SULFATE 1 MG/ML IJ SOLN: 0.2500 mg | Freq: Once | INTRAMUSCULAR | Status: AC | PRN

## 2012-09-18 MED ORDER — ONDANSETRON HCL 4 MG/2ML IJ SOLN: 4.0000 mg | Freq: Four times a day (QID) | INTRAMUSCULAR | Status: DC | PRN

## 2012-09-18 MED ORDER — CITRIC ACID-SODIUM CITRATE 334-500 MG/5ML PO SOLN
30.0000 mL | ORAL | Status: DC | PRN
  Administered 2012-09-21: 30 mL via ORAL
  Filled 2012-09-18: qty 15

## 2012-09-18 MED ORDER — LACTATED RINGERS IV SOLN
INTRAVENOUS | Status: DC
  Administered 2012-09-18 – 2012-09-20 (×4): via INTRAVENOUS
  Administered 2012-09-20: 125 mL/h via INTRAVENOUS
  Administered 2012-09-20: 22:00:00 via INTRAVENOUS

## 2012-09-18 MED ORDER — OXYTOCIN 40 UNITS IN LACTATED RINGERS INFUSION - SIMPLE MED
62.5000 mL/h | INTRAVENOUS | Status: DC
  Filled 2012-09-18: qty 1000

## 2012-09-18 MED ORDER — PENICILLIN G POTASSIUM 5000000 UNITS IJ SOLR
2.5000 10*6.[IU] | INTRAVENOUS | Status: DC
  Administered 2012-09-19 (×5): 2.5 10*6.[IU] via INTRAVENOUS
  Filled 2012-09-18 (×14): qty 2.5

## 2012-09-18 MED ORDER — LIDOCAINE HCL (PF) 1 % IJ SOLN
30.0000 mL | INTRAMUSCULAR | Status: DC | PRN
  Filled 2012-09-18: qty 30

## 2012-09-18 MED ORDER — ZOLPIDEM TARTRATE 5 MG PO TABS
5.0000 mg | ORAL_TABLET | Freq: Every evening | ORAL | Status: DC | PRN
  Administered 2012-09-19: 5 mg via ORAL
  Filled 2012-09-18: qty 1

## 2012-09-18 MED ORDER — OXYTOCIN BOLUS FROM INFUSION: 500.0000 mL | INTRAVENOUS | Status: DC

## 2012-09-18 MED ORDER — BUTORPHANOL TARTRATE 1 MG/ML IJ SOLN: 1.0000 mg | INTRAMUSCULAR | Status: DC | PRN

## 2012-09-18 MED ORDER — ACETAMINOPHEN 325 MG PO TABS: 650.0000 mg | ORAL_TABLET | ORAL | Status: DC | PRN

## 2012-09-18 MED ORDER — IBUPROFEN 600 MG PO TABS: 600.0000 mg | ORAL_TABLET | Freq: Four times a day (QID) | ORAL | Status: DC | PRN

## 2012-09-18 MED ORDER — LACTATED RINGERS IV SOLN
500.0000 mL | INTRAVENOUS | Status: DC | PRN
  Administered 2012-09-20 – 2012-09-21 (×2): 500 mL via INTRAVENOUS

## 2012-09-19 MED ORDER — OXYTOCIN 40 UNITS IN LACTATED RINGERS INFUSION - SIMPLE MED
1.0000 m[IU]/min | INTRAVENOUS | Status: DC
  Administered 2012-09-19: 1 m[IU]/min via INTRAVENOUS
  Administered 2012-09-19: 6 m[IU]/min via INTRAVENOUS

## 2012-09-19 MED ORDER — OXYTOCIN 40 UNITS IN LACTATED RINGERS INFUSION - SIMPLE MED: 1.0000 m[IU]/min | INTRAVENOUS | Status: DC

## 2012-09-19 MED ORDER — NALBUPHINE SYRINGE 5 MG/0.5 ML
10.0000 mg | INJECTION | Freq: Four times a day (QID) | INTRAMUSCULAR | Status: DC | PRN
  Filled 2012-09-19: qty 1

## 2012-09-19 MED ORDER — PROMETHAZINE HCL 25 MG/ML IJ SOLN: 25.0000 mg | Freq: Four times a day (QID) | INTRAMUSCULAR | Status: DC | PRN

## 2012-09-19 MED ORDER — OXYTOCIN 10 UNIT/ML IJ SOLN
40.0000 [IU] | INTRAVENOUS | Status: DC
  Filled 2012-09-19: qty 4

## 2012-09-19 MED ORDER — TERBUTALINE SULFATE 1 MG/ML IJ SOLN
0.2500 mg | Freq: Once | INTRAMUSCULAR | Status: AC | PRN
  Filled 2012-09-19: qty 1

## 2012-09-19 MED ORDER — NALBUPHINE SYRINGE 5 MG/0.5 ML
10.0000 mg | INJECTION | INTRAMUSCULAR | Status: DC | PRN
  Filled 2012-09-19: qty 1

## 2012-09-19 NOTE — Progress Notes (Signed)
D/w pt plan of care.  Turn pitocin off, have dinner, shower, and restart pitocin or continue with cytotec as appropriate.  Pt agrees with plan of care.

## 2012-09-19 NOTE — Progress Notes (Signed)
Called MD to verify plan of care. MD stated to rest pt until 12mn and assess to place Cytotec or start pitocin as appropriate, hold PCn until active labor. Pt notified about POC

## 2012-09-19 NOTE — H&P (Signed)
Laurie [name] is a 32 y.o. female presenting for IOL. Maternal Medical History:  Reason for admission: 18 y o G2 P0.  EDC 09-10-12.  Presents for IOL for postdates.  Fetal activity: Perceived fetal activity is normal.   Last perceived fetal movement was within the past hour.    Prenatal complications: no prenatal complications Prenatal Complications - Diabetes: none.    OB History   Grav Para Term Preterm Abortions TAB SAB Ect Mult Living   2 0 0 0 1 0 1 0 0 0      Past Medical History  Diagnosis Date  . Heart murmur     at birth   Past Surgical History  Procedure Laterality Date  . No past surgeries     Family History: family history includes Hyperlipidemia in her father and Hypertension in her father and mother.  There is no history of Anesthesia problems. Social History:  reports that she has quit smoking. Her smoking use included Cigarettes. She smoked 0.00 packs per day for 1 year. She has never used smokeless tobacco. She reports that she uses illicit drugs (Marijuana). She reports that she does not drink alcohol.   Prenatal Transfer Tool  Maternal Diabetes: No Genetic Screening: Normal Maternal Ultrasounds/Referrals: Normal Fetal Ultrasounds or other Referrals:  None Maternal Substance Abuse:  No Significant Maternal Medications:  Meds include: Other:   PNV Significant Maternal Lab Results:  Lab values include: Group B Strep positive Other Comments:  None  Review of Systems  All other systems reviewed and are negative.    Dilation: 1.5 Effacement (%): 50 Station: -3 Exam by:: ansah-mensah, rnc Blood pressure 119/46, pulse 69, temperature 98.2 F (36.8 C), temperature source Oral, resp. rate 20, height 5\' 7"  (1.702 m), weight 192 lb (87.091 kg), last menstrual period 07/13/2011. Maternal Exam:  Abdomen: Patient reports no abdominal tenderness. Fetal presentation: vertex  Introitus: Normal vulva. Normal vagina.  Pelvis: adequate for delivery.    Cervix: Cervix evaluated by digital exam.     Physical Exam  Nursing note and vitals reviewed. Constitutional: She is oriented to person, place, and time. She appears well-developed and well-nourished.  HENT:  Head: Normocephalic and atraumatic.  Eyes: Conjunctivae are normal. Pupils are equal, round, and reactive to light.  Neck: Normal range of motion. Neck supple.  Cardiovascular: Normal rate and regular rhythm.   Respiratory: Effort normal and breath sounds normal.  GI: Soft.  Genitourinary: Vagina normal and uterus normal.  Musculoskeletal: Normal range of motion.  Neurological: She is alert and oriented to person, place, and time.  Skin: Skin is warm and dry.  Psychiatric: She has a normal mood and affect. Her behavior is normal. Judgment and thought content normal.    Prenatal labs: ABO, Rh: B/Positive/-- (07/29 0000) Antibody: Negative (07/29 0000) Rubella: Immune (07/29 0000) RPR: NON REACTIVE (02/24 2006)  HBsAg: Negative (07/29 0000)  HIV: Non-reactive (07/29 0000)  GBS: Positive (01/13 0000)   Assessment/Plan: Postdates.  2 stage IOL.   Johnston Maddocks A 09/19/2012, 9:06 AM

## 2012-09-19 NOTE — Progress Notes (Signed)
Dalton Sabre Romberger is a 32 y.o. G2P0010 at [redacted]w[redacted]d by LMP admitted for induction of labor due to Post dates. Due date 09-10-12.  Subjective:   Objective: BP 119/46  Pulse 69  Temp(Src) 98.2 F (36.8 C) (Oral)  Resp 20  Ht 5\' 7"  (1.702 m)  Wt 192 lb (87.091 kg)  BMI 30.06 kg/m2  LMP 07/13/2011      FHT:  FHR: 150 bpm, variability: moderate,  accelerations:  Present,  decelerations:  Absent UC:   Irregular SVE:   Dilation: 1.5 Effacement (%): 50 Station: -3 Exam by:: ansah-mensah, rnc  Labs: Lab Results  Component Value Date   WBC 6.6 09/18/2012   HGB 13.1 09/18/2012   HCT 38.3 09/18/2012   MCV 81.0 09/18/2012   PLT 163 09/18/2012    Assessment / Plan: Induction of labor due to postterm,  progressing well on pitocin  Labor: Latent phase Preeclampsia:  n/a Fetal Wellbeing:  Category I Pain Control:  Nubain I/D:  n/a Anticipated MOD:  NSVD  Kataleyah Carducci A 09/19/2012, 9:12 AM

## 2012-09-20 DIAGNOSIS — O480 Post-term pregnancy: Secondary | ICD-10-CM | POA: Diagnosis present

## 2012-09-20 MED ORDER — DIPHENHYDRAMINE HCL 50 MG/ML IJ SOLN: 12.5000 mg | INTRAMUSCULAR | Status: DC | PRN

## 2012-09-20 MED ORDER — PENICILLIN G POTASSIUM 5000000 UNITS IJ SOLR
5.0000 10*6.[IU] | Freq: Once | INTRAVENOUS | Status: AC
  Administered 2012-09-20: 5 10*6.[IU] via INTRAVENOUS
  Filled 2012-09-20: qty 5

## 2012-09-20 MED ORDER — PENICILLIN G POTASSIUM 5000000 UNITS IJ SOLR: 2.5000 10*6.[IU] | INTRAVENOUS | Status: DC

## 2012-09-20 MED ORDER — PHENYLEPHRINE 40 MCG/ML (10ML) SYRINGE FOR IV PUSH (FOR BLOOD PRESSURE SUPPORT)
80.0000 ug | PREFILLED_SYRINGE | INTRAVENOUS | Status: DC | PRN
  Filled 2012-09-20: qty 5

## 2012-09-20 MED ORDER — PHENYLEPHRINE 40 MCG/ML (10ML) SYRINGE FOR IV PUSH (FOR BLOOD PRESSURE SUPPORT): 80.0000 ug | PREFILLED_SYRINGE | INTRAVENOUS | Status: DC | PRN

## 2012-09-20 MED ORDER — EPHEDRINE 5 MG/ML INJ: 10.0000 mg | INTRAVENOUS | Status: DC | PRN

## 2012-09-20 MED ORDER — EPHEDRINE 5 MG/ML INJ
10.0000 mg | INTRAVENOUS | Status: DC | PRN
  Filled 2012-09-20: qty 4

## 2012-09-20 MED ORDER — PENICILLIN G POTASSIUM 5000000 UNITS IJ SOLR
2.5000 10*6.[IU] | INTRAVENOUS | Status: DC
  Administered 2012-09-20 – 2012-09-21 (×3): 2.5 10*6.[IU] via INTRAVENOUS
  Filled 2012-09-20 (×6): qty 2.5

## 2012-09-20 MED ORDER — TERBUTALINE SULFATE 1 MG/ML IJ SOLN
0.2500 mg | Freq: Once | INTRAMUSCULAR | Status: AC | PRN
  Administered 2012-09-20: 0.25 mg via SUBCUTANEOUS

## 2012-09-20 MED ORDER — LIDOCAINE HCL (PF) 1 % IJ SOLN
INTRAMUSCULAR | Status: DC | PRN
  Administered 2012-09-20 (×2): 5 mL

## 2012-09-20 MED ORDER — PENICILLIN G POTASSIUM 5000000 UNITS IJ SOLR: 5.0000 10*6.[IU] | Freq: Once | INTRAVENOUS | Status: DC

## 2012-09-20 MED ORDER — OXYTOCIN 40 UNITS IN LACTATED RINGERS INFUSION - SIMPLE MED
1.0000 m[IU]/min | INTRAVENOUS | Status: DC
  Administered 2012-09-20: 2 m[IU]/min via INTRAVENOUS

## 2012-09-20 MED ORDER — LACTATED RINGERS IV SOLN
500.0000 mL | Freq: Once | INTRAVENOUS | Status: AC
  Administered 2012-09-20: 500 mL via INTRAVENOUS

## 2012-09-20 MED ORDER — LACTATED RINGERS IV BOLUS (SEPSIS)
500.0000 mL | Freq: Once | INTRAVENOUS | Status: AC
  Administered 2012-09-20: 500 mL via INTRAVENOUS

## 2012-09-20 MED ORDER — LACTATED RINGERS IV SOLN
INTRAVENOUS | Status: AC
  Administered 2012-09-20: 15:00:00 via INTRAUTERINE

## 2012-09-20 MED ORDER — FENTANYL 2.5 MCG/ML BUPIVACAINE 1/10 % EPIDURAL INFUSION (WH - ANES)
14.0000 mL/h | INTRAMUSCULAR | Status: DC
  Administered 2012-09-20 – 2012-09-21 (×2): 14 mL/h via EPIDURAL
  Filled 2012-09-20 (×2): qty 125

## 2012-09-20 NOTE — Progress Notes (Signed)
Pt up to bathroom.  Pt sitting on side of bed upon return from bathroom

## 2012-09-20 NOTE — Progress Notes (Signed)
Patient ID: Laurie [name], female   DOB: 18-Nov-1980, 32 y.o.   MRN: [id number] Laurie [name] Human is a 32 y.o. G2P0010 at [redacted]w[redacted]d by LMP admitted for induction of labor due to Post dates. Due date 09-10-12.  Subjective: Comfortable   Objective: BP 133/87  Pulse 68  Temp(Src) 97.7 F (36.5 C) (Oral)  Resp 18  Ht 5\' 7"  (1.702 m)  Wt 87.091 kg (192 lb)  BMI 30.06 kg/m2  LMP 07/13/2011      FHT:  FHR: 150 bpm, variability: moderate,  accelerations:  Present,  decelerations:  Absent UC:   Irregular SVE:   Dilation: 3 Effacement (%): 80 Station: -2 Exam by:: S Aurélie.Currier RN    Assessment / Plan: Induction of labor due to postterm, prodromal labor  Labor: Latent phase Preeclampsia:  n/a Fetal Wellbeing:  Category I Pain Control:  Nubain I/D:  n/a Anticipated MOD:  NSVD  JACKSON-MOORE,Kinsley Holderman A 09/20/2012, 1:38 PM

## 2012-09-20 NOTE — Progress Notes (Signed)
Amnioinfusion completed.

## 2012-09-20 NOTE — Anesthesia Preprocedure Evaluation (Signed)

## 2012-09-20 NOTE — Progress Notes (Signed)
Position changed to rt lateral

## 2012-09-20 NOTE — Progress Notes (Signed)
Pt up to bathroom.

## 2012-09-20 NOTE — Progress Notes (Signed)
Patient ID: Laurie [name], female   DOB: Dec 26, 1980, 32 y.o.   MRN: [id number] Laurie [name] is a 32 y.o. G2P0010 at [redacted]w[redacted]d by LMP admitted for induction of labor due to Post dates. Due date 09-10-12.  Subjective: Comfortable   Objective: BP 139/72  Pulse 78  Temp(Src) 98.2 F (36.8 C) (Oral)  Resp 18  Ht 5\' 7"  (1.702 m)  Wt 87.091 kg (192 lb)  BMI 30.06 kg/m2  SpO2 100%  LMP 07/13/2011      FHT:  FHR: 120 bpm, variability: minimal ,  accelerations:  Present,  decelerations:  Present early UC:   Irregular SVE:   Dilation: 3.5 Effacement (%): 90 Station: -2 Exam by:: L Lamon RN    Assessment / Plan: Induction of labor due to postterm, latent labor  Labor: Latent phase Preeclampsia:  n/a Fetal Wellbeing:  Category I Pain Control:  Epidural I/D:  n/a Anticipated MOD:  NSVD  JACKSON-MOORE,Madigan Rosensteel A 09/20/2012, 10:18 PM

## 2012-09-20 NOTE — Progress Notes (Signed)
Patient ID: Laurie [name], female   DOB: May 25, 1981, 32 y.o.   MRN: [id number] Laurie [name] is a 32 y.o. G2P0010 at [redacted]w[redacted]d by LMP admitted for induction of labor due to Post dates. Due date 09-10-12.  Subjective: Comfortable   Objective: BP 126/78  Pulse 75  Temp(Src) 97.8 F (36.6 C) (Oral)  Resp 16  Ht 5\' 7"  (1.702 m)  Wt 87.091 kg (192 lb)  BMI 30.06 kg/m2  LMP 07/13/2011      FHT:  FHR: 150 bpm, variability: moderate,  accelerations:  Present,  decelerations:  Absent UC:   Irregular SVE:   Dilation: 1.5 Effacement (%): Thick Station: -1 Exam by:: S Grindstaff RN  Labs: Lab Results  Component Value Date   WBC 6.6 09/18/2012   HGB 13.1 09/18/2012   HCT 38.3 09/18/2012   MCV 81.0 09/18/2012   PLT 163 09/18/2012    Assessment / Plan: Induction of labor due to postterm, borderline Bishop's score  Labor: Latent phase Preeclampsia:  n/a Fetal Wellbeing:  Category I Pain Control:  Nubain I/D:  n/a Anticipated MOD:  NSVD  JACKSON-MOORE,Hoyte Ziebell A 09/20/2012, 9:35 AM

## 2012-09-20 NOTE — Anesthesia Procedure Notes (Signed)
Epidural Patient location during procedure: OB Start time: 09/20/2012 6:31 PM  Staffing Anesthesiologist: Angus Seller., Harrell Gave. Performed by: anesthesiologist   Preanesthetic Checklist Completed: patient identified, site marked, surgical consent, pre-op evaluation, timeout performed, IV checked, risks and benefits discussed and monitors and equipment checked  Epidural Patient position: sitting Prep: site prepped and draped and DuraPrep Patient monitoring: continuous pulse ox and blood pressure Approach: midline Injection technique: LOR air and LOR saline  Needle:  Needle type: Tuohy  Needle gauge: 17 G Needle length: 9 cm and 9 Needle insertion depth: 5 cm cm Catheter type: closed end flexible Catheter size: 19 Gauge Catheter at skin depth: 10 cm Test dose: negative  Assessment Events: blood not aspirated, injection not painful, no injection resistance, negative IV test and no paresthesia  Additional Notes Patient identified.  Risk benefits discussed including failed block, incomplete pain control, headache, nerve damage, paralysis, blood pressure changes, nausea, vomiting, reactions to medication both toxic or allergic, and postpartum back pain.  Patient expressed understanding and wished to proceed.  All questions were answered.  Sterile technique used throughout procedure and epidural site dressed with sterile barrier dressing. No paresthesia or other complications noted.The patient did not experience any signs of intravascular injection such as tinnitus or metallic taste in mouth nor signs of intrathecal spread such as rapid motor block. Please see nursing notes for vital signs.

## 2012-09-21 ENCOUNTER — Encounter (HOSPITAL_COMMUNITY)
Admission: RE | Disposition: A | Discharge status: Home / Self Care | Payer: Self-pay | Source: Ambulatory Visit | Attending: Obstetrics & Gynecology

## 2012-09-21 ENCOUNTER — Inpatient Hospital Stay (HOSPITAL_COMMUNITY): Payer: Medicaid Other | Admitting: Anesthesiology

## 2012-09-21 ENCOUNTER — Encounter (HOSPITAL_COMMUNITY): Payer: Self-pay

## 2012-09-21 ENCOUNTER — Encounter (HOSPITAL_COMMUNITY): Payer: Self-pay | Admitting: Anesthesiology

## 2012-09-21 LAB — HEMOGLOBIN AND HEMATOCRIT, BLOOD
HCT: 33.7 % — ABNORMAL LOW (ref 36.0–46.0)
Hemoglobin: 11.4 g/dL — ABNORMAL LOW (ref 12.0–15.0)

## 2012-09-21 SURGERY — Surgical Case
Anesthesia: Epidural | Site: Abdomen | Wound class: Clean Contaminated

## 2012-09-21 MED ORDER — HYDROMORPHONE HCL PF 1 MG/ML IJ SOLN: 0.2500 mg | INTRAMUSCULAR | Status: DC | PRN

## 2012-09-21 MED ORDER — IBUPROFEN 600 MG PO TABS: 600.0000 mg | ORAL_TABLET | Freq: Four times a day (QID) | ORAL | Status: DC | PRN

## 2012-09-21 MED ORDER — TETANUS-DIPHTH-ACELL PERTUSSIS 5-2.5-18.5 LF-MCG/0.5 IM SUSP
0.5000 mL | Freq: Once | INTRAMUSCULAR | Status: AC
  Administered 2012-09-22: 0.5 mL via INTRAMUSCULAR

## 2012-09-21 MED ORDER — PRENATAL MULTIVITAMIN CH
1.0000 | ORAL_TABLET | Freq: Every day | ORAL | Status: DC
  Administered 2012-09-22 – 2012-09-23 (×2): 1 via ORAL
  Filled 2012-09-21 (×2): qty 1

## 2012-09-21 MED ORDER — SODIUM BICARBONATE 8.4 % IV SOLN
INTRAVENOUS | Status: AC
  Filled 2012-09-21: qty 50

## 2012-09-21 MED ORDER — LIDOCAINE-EPINEPHRINE (PF) 2 %-1:200000 IJ SOLN
INTRAMUSCULAR | Status: AC
  Filled 2012-09-21: qty 20

## 2012-09-21 MED ORDER — SCOPOLAMINE 1 MG/3DAYS TD PT72: 1.0000 | MEDICATED_PATCH | Freq: Once | TRANSDERMAL | Status: DC

## 2012-09-21 MED ORDER — ONDANSETRON HCL 4 MG/2ML IJ SOLN
INTRAMUSCULAR | Status: AC
  Filled 2012-09-21: qty 2

## 2012-09-21 MED ORDER — OXYTOCIN 40 UNITS IN LACTATED RINGERS INFUSION - SIMPLE MED: 62.5000 mL/h | INTRAVENOUS | Status: AC

## 2012-09-21 MED ORDER — DEXTROSE 5 % IV SOLN: 1.0000 ug/kg/h | INTRAVENOUS | Status: DC | PRN

## 2012-09-21 MED ORDER — DIPHENHYDRAMINE HCL 50 MG/ML IJ SOLN: 12.5000 mg | INTRAMUSCULAR | Status: DC | PRN

## 2012-09-21 MED ORDER — LACTATED RINGERS IV SOLN
INTRAVENOUS | Status: DC | PRN
  Administered 2012-09-21 (×3): via INTRAVENOUS

## 2012-09-21 MED ORDER — MEASLES, MUMPS & RUBELLA VAC ~~LOC~~ INJ: 0.5000 mL | INJECTION | Freq: Once | SUBCUTANEOUS | Status: DC

## 2012-09-21 MED ORDER — MORPHINE SULFATE (PF) 0.5 MG/ML IJ SOLN
INTRAMUSCULAR | Status: DC | PRN
  Administered 2012-09-21: 1 mg via INTRAVENOUS

## 2012-09-21 MED ORDER — ONDANSETRON HCL 4 MG PO TABS: 4.0000 mg | ORAL_TABLET | ORAL | Status: DC | PRN

## 2012-09-21 MED ORDER — NALOXONE HCL 0.4 MG/ML IJ SOLN: 0.4000 mg | INTRAMUSCULAR | Status: DC | PRN

## 2012-09-21 MED ORDER — FERROUS SULFATE 325 (65 FE) MG PO TABS
325.0000 mg | ORAL_TABLET | Freq: Two times a day (BID) | ORAL | Status: DC
  Administered 2012-09-21 – 2012-09-23 (×4): 325 mg via ORAL
  Filled 2012-09-21 (×4): qty 1

## 2012-09-21 MED ORDER — OXYTOCIN 10 UNIT/ML IJ SOLN
INTRAMUSCULAR | Status: AC
  Filled 2012-09-21: qty 4

## 2012-09-21 MED ORDER — MEPERIDINE HCL 25 MG/ML IJ SOLN: 6.2500 mg | INTRAMUSCULAR | Status: DC | PRN

## 2012-09-21 MED ORDER — DIPHENHYDRAMINE HCL 50 MG/ML IJ SOLN: 25.0000 mg | INTRAMUSCULAR | Status: DC | PRN

## 2012-09-21 MED ORDER — OXYTOCIN 10 UNIT/ML IJ SOLN
40.0000 [IU] | INTRAVENOUS | Status: DC | PRN
  Administered 2012-09-21: 40 [IU] via INTRAVENOUS

## 2012-09-21 MED ORDER — KETOROLAC TROMETHAMINE 30 MG/ML IJ SOLN: 30.0000 mg | Freq: Four times a day (QID) | INTRAMUSCULAR | Status: AC | PRN

## 2012-09-21 MED ORDER — KETOROLAC TROMETHAMINE 60 MG/2ML IM SOLN
INTRAMUSCULAR | Status: AC
  Administered 2012-09-21: 60 mg via INTRAMUSCULAR
  Filled 2012-09-21: qty 2

## 2012-09-21 MED ORDER — DIPHENHYDRAMINE HCL 25 MG PO CAPS: 25.0000 mg | ORAL_CAPSULE | Freq: Four times a day (QID) | ORAL | Status: DC | PRN

## 2012-09-21 MED ORDER — MORPHINE SULFATE 0.5 MG/ML IJ SOLN
INTRAMUSCULAR | Status: AC
  Filled 2012-09-21: qty 10

## 2012-09-21 MED ORDER — HYDROMORPHONE HCL PF 1 MG/ML IJ SOLN
INTRAMUSCULAR | Status: AC
  Administered 2012-09-21: 0.5 mg via INTRAVENOUS
  Filled 2012-09-21: qty 1

## 2012-09-21 MED ORDER — ZOLPIDEM TARTRATE 5 MG PO TABS: 5.0000 mg | ORAL_TABLET | Freq: Every evening | ORAL | Status: DC | PRN

## 2012-09-21 MED ORDER — SCOPOLAMINE 1 MG/3DAYS TD PT72
MEDICATED_PATCH | TRANSDERMAL | Status: AC
  Administered 2012-09-21: 1.5 mg via TRANSDERMAL
  Filled 2012-09-21: qty 1

## 2012-09-21 MED ORDER — ONDANSETRON HCL 4 MG/2ML IJ SOLN: 4.0000 mg | INTRAMUSCULAR | Status: DC | PRN

## 2012-09-21 MED ORDER — DIPHENHYDRAMINE HCL 25 MG PO CAPS: 25.0000 mg | ORAL_CAPSULE | ORAL | Status: DC | PRN

## 2012-09-21 MED ORDER — NALBUPHINE HCL 10 MG/ML IJ SOLN
5.0000 mg | INTRAMUSCULAR | Status: DC | PRN
  Filled 2012-09-21: qty 1

## 2012-09-21 MED ORDER — KETOROLAC TROMETHAMINE 30 MG/ML IJ SOLN
30.0000 mg | Freq: Four times a day (QID) | INTRAMUSCULAR | Status: AC | PRN
  Administered 2012-09-21: 30 mg via INTRAVENOUS
  Filled 2012-09-21: qty 1

## 2012-09-21 MED ORDER — LANOLIN HYDROUS EX OINT: 1.0000 "application " | TOPICAL_OINTMENT | CUTANEOUS | Status: DC | PRN

## 2012-09-21 MED ORDER — OXYCODONE-ACETAMINOPHEN 5-325 MG PO TABS
1.0000 | ORAL_TABLET | ORAL | Status: DC | PRN
  Administered 2012-09-22 (×3): 1 via ORAL
  Administered 2012-09-23: 2 via ORAL
  Filled 2012-09-21 (×2): qty 1
  Filled 2012-09-21: qty 2
  Filled 2012-09-21: qty 1

## 2012-09-21 MED ORDER — WITCH HAZEL-GLYCERIN EX PADS: 1.0000 "application " | MEDICATED_PAD | CUTANEOUS | Status: DC | PRN

## 2012-09-21 MED ORDER — LACTATED RINGERS IV SOLN
INTRAVENOUS | Status: DC
  Administered 2012-09-21: 22:00:00 via INTRAVENOUS

## 2012-09-21 MED ORDER — ONDANSETRON HCL 4 MG/2ML IJ SOLN: 4.0000 mg | Freq: Three times a day (TID) | INTRAMUSCULAR | Status: DC | PRN

## 2012-09-21 MED ORDER — FENTANYL CITRATE 0.05 MG/ML IJ SOLN
INTRAMUSCULAR | Status: AC
  Filled 2012-09-21: qty 2

## 2012-09-21 MED ORDER — MORPHINE SULFATE (PF) 0.5 MG/ML IJ SOLN
INTRAMUSCULAR | Status: DC | PRN
  Administered 2012-09-21: 4 mg via EPIDURAL

## 2012-09-21 MED ORDER — IBUPROFEN 600 MG PO TABS
600.0000 mg | ORAL_TABLET | Freq: Four times a day (QID) | ORAL | Status: DC
  Administered 2012-09-21 – 2012-09-23 (×8): 600 mg via ORAL
  Filled 2012-09-21 (×8): qty 1

## 2012-09-21 MED ORDER — MAGNESIUM HYDROXIDE 400 MG/5ML PO SUSP: 30.0000 mL | ORAL | Status: DC | PRN

## 2012-09-21 MED ORDER — SENNOSIDES-DOCUSATE SODIUM 8.6-50 MG PO TABS
2.0000 | ORAL_TABLET | Freq: Every day | ORAL | Status: DC
  Administered 2012-09-21 – 2012-09-22 (×2): 2 via ORAL

## 2012-09-21 MED ORDER — SIMETHICONE 80 MG PO CHEW
80.0000 mg | CHEWABLE_TABLET | ORAL | Status: DC | PRN
  Administered 2012-09-21 – 2012-09-22 (×4): 80 mg via ORAL

## 2012-09-21 MED ORDER — LACTATED RINGERS IV SOLN
INTRAVENOUS | Status: DC | PRN
  Administered 2012-09-21: 06:00:00 via INTRAVENOUS

## 2012-09-21 MED ORDER — SODIUM CHLORIDE 0.9 % IJ SOLN: 3.0000 mL | INTRAMUSCULAR | Status: DC | PRN

## 2012-09-21 MED ORDER — ONDANSETRON HCL 4 MG/2ML IJ SOLN
INTRAMUSCULAR | Status: DC | PRN
  Administered 2012-09-21: 4 mg via INTRAVENOUS

## 2012-09-21 MED ORDER — CEFAZOLIN SODIUM-DEXTROSE 2-3 GM-% IV SOLR
2.0000 g | Freq: Once | INTRAVENOUS | Status: AC
  Administered 2012-09-21: 2 g via INTRAVENOUS
  Filled 2012-09-21: qty 50

## 2012-09-21 MED ORDER — DIBUCAINE 1 % RE OINT: 1.0000 "application " | TOPICAL_OINTMENT | RECTAL | Status: DC | PRN

## 2012-09-21 MED ORDER — KETOROLAC TROMETHAMINE 60 MG/2ML IM SOLN: 60.0000 mg | Freq: Once | INTRAMUSCULAR | Status: AC | PRN

## 2012-09-21 MED ORDER — SODIUM BICARBONATE 8.4 % IV SOLN
INTRAVENOUS | Status: DC | PRN
  Administered 2012-09-21: 5 mL via EPIDURAL

## 2012-09-21 MED ORDER — METOCLOPRAMIDE HCL 5 MG/ML IJ SOLN: 10.0000 mg | Freq: Three times a day (TID) | INTRAMUSCULAR | Status: DC | PRN

## 2012-09-21 SURGICAL SUPPLY — 41 items
BENZOIN TINCTURE PRP APPL 2/3 (GAUZE/BANDAGES/DRESSINGS) ×2 IMPLANT
CANISTER WOUND CARE 500ML ATS (WOUND CARE) IMPLANT
CLOTH BEACON ORANGE TIMEOUT ST (SAFETY) ×2 IMPLANT
CONTAINER PREFILL 10% NBF 15ML (MISCELLANEOUS) IMPLANT
DRAPE LG THREE QUARTER DISP (DRAPES) ×2 IMPLANT
DRESSING TELFA 8X3 (GAUZE/BANDAGES/DRESSINGS) IMPLANT
DRSG OPSITE POSTOP 4X10 (GAUZE/BANDAGES/DRESSINGS) ×2 IMPLANT
DRSG VAC ATS LRG SENSATRAC (GAUZE/BANDAGES/DRESSINGS) IMPLANT
DRSG VAC ATS MED SENSATRAC (GAUZE/BANDAGES/DRESSINGS) IMPLANT
DRSG VAC ATS SM SENSATRAC (GAUZE/BANDAGES/DRESSINGS) IMPLANT
DURAPREP 26ML APPLICATOR (WOUND CARE) ×2 IMPLANT
ELECT REM PT RETURN 9FT ADLT (ELECTROSURGICAL) ×2
ELECTRODE REM PT RTRN 9FT ADLT (ELECTROSURGICAL) ×1 IMPLANT
EXTRACTOR VACUUM M CUP 4 TUBE (SUCTIONS) IMPLANT
GAUZE SPONGE 4X4 12PLY STRL LF (GAUZE/BANDAGES/DRESSINGS) IMPLANT
GLOVE BIO SURGEON STRL SZ 6.5 (GLOVE) ×2 IMPLANT
GOWN PREVENTION PLUS LG XLONG (DISPOSABLE) ×6 IMPLANT
KIT ABG SYR 3ML LUER SLIP (SYRINGE) IMPLANT
NEEDLE HYPO 25X5/8 SAFETYGLIDE (NEEDLE) IMPLANT
NS IRRIG 1000ML POUR BTL (IV SOLUTION) ×2 IMPLANT
PACK C SECTION WH (CUSTOM PROCEDURE TRAY) ×2 IMPLANT
PAD ABD 7.5X8 STRL (GAUZE/BANDAGES/DRESSINGS) ×2 IMPLANT
PAD OB MATERNITY 4.3X12.25 (PERSONAL CARE ITEMS) ×2 IMPLANT
RTRCTR C-SECT PINK 25CM LRG (MISCELLANEOUS) IMPLANT
SLEEVE SCD COMPRESS KNEE MED (MISCELLANEOUS) ×2 IMPLANT
STAPLER VISISTAT 35W (STAPLE) IMPLANT
STRIP CLOSURE SKIN 1/2X4 (GAUZE/BANDAGES/DRESSINGS) ×2 IMPLANT
SUT MNCRL 0 VIOLET CTX 36 (SUTURE) ×3 IMPLANT
SUT MNCRL AB 3-0 PS2 27 (SUTURE) ×2 IMPLANT
SUT MONOCRYL 0 CTX 36 (SUTURE) ×3
SUT PDS AB 0 CTX 36 PDP370T (SUTURE) IMPLANT
SUT PLAIN 0 NONE (SUTURE) IMPLANT
SUT VIC AB 0 CTXB 36 (SUTURE) ×2 IMPLANT
SUT VIC AB 2-0 CT1 (SUTURE) ×2 IMPLANT
SUT VIC AB 2-0 CT1 27 (SUTURE) ×1
SUT VIC AB 2-0 CT1 TAPERPNT 27 (SUTURE) ×1 IMPLANT
SUT VIC AB 2-0 SH 27 (SUTURE)
SUT VIC AB 2-0 SH 27XBRD (SUTURE) IMPLANT
TOWEL OR 17X24 6PK STRL BLUE (TOWEL DISPOSABLE) ×6 IMPLANT
TRAY FOLEY CATH 14FR (SET/KITS/TRAYS/PACK) IMPLANT
WATER STERILE IRR 1000ML POUR (IV SOLUTION) IMPLANT

## 2012-09-21 NOTE — Anesthesia Postprocedure Evaluation (Signed)
Anesthesia Post Note  Patient: Laurie [name]  Procedure(s) Performed: Procedure(s) (LRB): CESAREAN SECTION (N/A)  Anesthesia type: Epidural  Patient location: PACU  Post pain: Pain level controlled  Post assessment: Post-op Vital signs reviewed  Last Vitals:  Filed Vitals:   09/21/12 0715  BP: 136/81  Pulse: 81  Temp:   Resp: 26    Post vital signs: Reviewed  Level of consciousness: awake  Complications: No apparent anesthesia complications

## 2012-09-21 NOTE — Progress Notes (Signed)
Dr Tamela Oddi at bedside to explain risks and benefits of c section for arrest of descent.  Pt verbalized understanding and signed consent.

## 2012-09-21 NOTE — Progress Notes (Signed)
UR completed 

## 2012-09-21 NOTE — Progress Notes (Signed)
Dr Tamela Oddi updated on FHR, UC pattern, SVE, and bloody show.  Orders given to continue to labor down.

## 2012-09-21 NOTE — Progress Notes (Signed)
Dr Tamela Oddi at bedside to assess pt.  Reviewed strip and gave orders to restart pushing and go up on pitocin per orders.

## 2012-09-21 NOTE — Op Note (Signed)
Cesarean Section Procedure Note   Laurie [name]   09/18/2012 - 09/21/2012  Indications: Arrest of descent   Pre-operative Diagnosis: Arrest of Descent.   Post-operative Diagnosis: Same   Surgeon: Antionette Char A  Assistants: none  Anesthesia: epidural  Procedure Details:  The patient was seen in the Holding Room. The risks, benefits, complications, treatment options, and expected outcomes were discussed with the patient. The patient concurred with the proposed plan, giving informed consent. The patient was identified as Jamse Arn and the procedure verified as C-Section Delivery. A Time Out was held and the above information confirmed.  After induction of anesthesia, the patient was draped and prepped in the usual sterile manner. A transverse incision was made and carried down through the subcutaneous tissue to the fascia. The fascial incision was made and extended transversely. The fascia was separated from the underlying rectus tissue superiorly and inferiorly. The peritoneum was identified and entered. The peritoneal incision was extended longitudinally.  A low transverse uterine incision was made. Delivered from cephalic presentation was a  living newborn female infant. APGAR (1 MIN): 8   APGAR (5 MINS): 9      A cord ph was not sent. The umbilical cord was clamped and cut cord. A sample was obtained for evaluation. The placenta was removed Intact and appeared normal.  The uterine incision was closed with running locked sutures of 1-0 Monocryl. The uterine incision at the left margin extended into the broad ligament.  An O'Leary suture was placed for hemostasis.  A second imbricating layer of the same suture was placed.  Hemostasis was observed. The paracolic gutters were irrigated. The parieto peritoneum was closed in a running fashion with 2-0 Vicryl.  The fascia was then reapproximated with running sutures of 0 Vicryl.  The skin was closed with suture.  Instrument,  sponge, and needle counts were correct prior the abdominal closure and were correct at the conclusion of the case.    Findings:  See above   Estimated Blood Loss:  600 and  ml  Total IV Fluids: per Anesthesiology  Urine Output: per Anesthesiology  Specimens: @ORSPECIMEN @   Complications: no complications  Disposition: PACU - hemodynamically stable.  Maternal Condition: stable   Baby condition / location:  nursery-stable    Signed: Surgeon(s): Antionette Char, MD

## 2012-09-21 NOTE — Transfer of Care (Signed)
Immediate Anesthesia Transfer of Care Note  Patient: Laurie [name]  Procedure(s) Performed: Procedure(s): CESAREAN SECTION (N/A)  Patient Location: PACU  Anesthesia Type:Epidural  Level of Consciousness: awake  Airway & Oxygen Therapy: Patient Spontanous Breathing  Post-op Assessment: Report given to PACU RN and Post -op Vital signs reviewed and stable  Post vital signs: stable  Complications: No apparent anesthesia complications

## 2012-09-21 NOTE — Progress Notes (Signed)
Patient ID: Laurie [name], female   DOB: 07-21-1981, 32 y.o.   MRN: [id number] Laurie [name] is [name] 32 y.o. G2P0010 at [redacted]w[redacted]d by LMP admitted for induction of labor due to Post dates. Due date 09-10-12.  Subjective: C/O pressure  Objective: BP 139/81  Pulse 76  Temp(Src) 98.9 F (37.2 C) (Oral)  Resp 20  Ht 5\' 7"  (1.702 m)  Wt 87.091 kg (192 lb)  BMI 30.06 kg/m2  SpO2 100%  LMP 07/13/2011   Total I/O In: -  Out: 1500 [Urine:1500]  FHT:  FHR: 120 bpm, variability: minimal ,  accelerations:  Present,  decelerations:  Present early UC:   Irregular SVE:   Dilation: 10 Effacement (%): 100 Station: 0 Exam by:: Dr Tamela Oddi    Assessment / Plan: Arrest of descent  Labor: Latent phase Preeclampsia:  [name] Fetal Wellbeing:  Category I Pain Control:  Epidural I/D:  [name] Anticipated MOD:  C/D  JACKSON-MOORE,Laurie [name] 09/21/2012, 5:06 AM

## 2012-09-21 NOTE — Progress Notes (Signed)
Dr Tamela Oddi updated on FHR, UC pattern, pt comfort level, and pushing progress.  Orders given to keep pitocin at current level.

## 2012-09-22 ENCOUNTER — Encounter (HOSPITAL_COMMUNITY): Payer: Self-pay | Admitting: Obstetrics & Gynecology

## 2012-09-22 LAB — CBC
Hemoglobin: 9.6 g/dL — ABNORMAL LOW (ref 12.0–15.0)
RBC: 3.45 MIL/uL — ABNORMAL LOW (ref 3.87–5.11)

## 2012-09-22 NOTE — Progress Notes (Signed)
Subjective: Postpartum Day 1: Cesarean Delivery Patient reports no problems voiding.    Objective: Vital signs in last 24 hours: Temp:  [97.5 F (36.4 C)-100 F (37.8 C)] 98.4 F (36.9 C) (02/28 0230) Pulse Rate:  [60-107] 88 (02/28 0230) Resp:  [16-21] 18 (02/28 0230) BP: (111-148)/(69-86) 121/71 mmHg (02/28 0230) SpO2:  [96 %-99 %] 98 % (02/27 1707)  Physical Exam:  General: alert and no distress Lochia: appropriate Uterine Fundus: firm Incision: healing well DVT Evaluation: No evidence of DVT seen on physical exam.   Recent Labs  09/21/12 1125  HGB 11.4*  HCT 33.7*    Assessment/Plan: Status post Cesarean section. Doing well postoperatively.  Continue current care.  HARPER,CHARLES A 09/22/2012, 4:50 AM

## 2012-09-23 MED ORDER — MEDROXYPROGESTERONE ACETATE 150 MG/ML IM SUSP: 150.0000 mg | INTRAMUSCULAR | Status: DC

## 2012-09-23 MED ORDER — OXYCODONE-ACETAMINOPHEN 5-325 MG PO TABS: 1.0000 | ORAL_TABLET | Freq: Four times a day (QID) | ORAL | Status: DC | PRN

## 2012-09-23 NOTE — Discharge Summary (Signed)
  Obstetric Discharge Summary Reason for Admission: induction of labor Prenatal Procedures: none Intrapartum Procedures: cesarean: low cervical, transverse Postpartum Procedures: none Complications-Operative and Postpartum: none  Hemoglobin  Date Value Range Status  09/22/2012 9.6* 12.0 - 15.0 g/dL Final     HCT  Date Value Range Status  09/22/2012 28.3* 36.0 - 46.0 % Final    Physical Exam:  General: alert Lochia: appropriate Uterine: firm Incision: clean, dry and intact DVT Evaluation: No evidence of DVT seen on physical exam.  Discharge Diagnoses: Active Problems:   Postmaturity pregnancy, 40-[redacted] weeks gestation   Arrested active phase of labor   Cesarean delivery delivered   Discharge Information: Date: 09/23/2012 Activity: pelvic rest Diet: routine Medications:  Prior to Admission medications   Medication Sig Start Date End Date Taking? Authorizing Provider  Prenatal Vit-Fe Fumarate-FA (MULTIVITAMIN-PRENATAL) 27-0.8 MG TABS Take 1 tablet by mouth daily.   Yes Historical Provider, MD  medroxyPROGESTERone (DEPO-PROVERA) 150 MG/ML injection Inject 1 mL (150 mg total) into the muscle every 3 (three) months. 09/23/12   Antionette Char, MD  oxyCODONE-acetaminophen (PERCOCET/ROXICET) 5-325 MG per tablet Take 1-2 tablets by mouth every 6 (six) hours as needed for pain. 09/23/12   Antionette Char, MD    Condition: stable Instructions: refer to routine discharge instructions Discharge to: home Follow-up Information   Follow up with HARPER,CHARLES A, MD. Schedule an appointment as soon as possible for a visit in 2 weeks.   Contact information:   [address] [place] [address] [phone]       Newborn Data: Live born  Information for the patient's newborn:  Reve, Crocket [[id number]]  female ; APGAR (1 MIN): 8   APGAR (5 MINS): 9    Home with mother.  JACKSON-MOORE,Denetria Luevanos A 09/23/2012, 10:40 AM

## 2012-11-08 ENCOUNTER — Ambulatory Visit: Payer: Self-pay | Admitting: Obstetrics

## 2012-11-15 ENCOUNTER — Ambulatory Visit (INDEPENDENT_AMBULATORY_CARE_PROVIDER_SITE_OTHER): Payer: Medicaid Other | Admitting: Obstetrics

## 2012-11-15 ENCOUNTER — Encounter: Payer: Self-pay | Admitting: Obstetrics

## 2012-11-15 VITALS — BP 133/91 | HR 69 | Temp 97.0°F | Ht 67.0 in | Wt 164.0 lb

## 2012-11-15 DIAGNOSIS — Z3049 Encounter for surveillance of other contraceptives: Secondary | ICD-10-CM

## 2012-11-15 MED ORDER — MEDROXYPROGESTERONE ACETATE 150 MG/ML IM SUSP: 150.0000 mg | INTRAMUSCULAR | Status: AC

## 2012-11-15 NOTE — Progress Notes (Signed)
Subjective:     Laurie [name] is a 32 y.o. female who presents for a postpartum visit. She is 8 weeks postpartum following a low cervical transverse Cesarean section. I have fully reviewed the prenatal and intrapartum course. The delivery was at 41 gestational weeks. Outcome: primary cesarean section, low transverse incision. Anesthesia: epidural. Postpartum course has been normal. Baby's course has been normal. Baby is feeding by bottle - Gerber GoodStart Gentle. Bleeding staining only. Bowel function is normal. Bladder function is normal. Patient is sexually active. Contraception method is Depo-Provera injections. Postpartum depression screening: negative.  The following portions of the patient's history were reviewed and updated as appropriate: allergies, current medications, past family history, past medical history, past social history, past surgical history and problem list.  Review of Systems Pertinent items are noted in HPI.   Objective:    There were no vitals taken for this visit.  General:  alert and no distress   Breasts:  inspection negative, no nipple discharge or bleeding, no masses or nodularity palpable  Lungs: Not examined  Heart:  Not examined  Abdomen: normal findings: soft, non-tender   Vulva:  normal  Vagina: normal vagina  Cervix:  no lesions  Corpus: normal size, contour, position, consistency, mobility, non-tender  Adnexa:  normal adnexa and no mass, fullness, tenderness  Rectal Exam: Not performed.        Assessment:     Normal postpartum exam. Pap smear not done at today's visit.   Plan:    1. Contraception: Depo-Provera injections 2. Instructions given. 3. Follow up in: 3 months or as needed.

## 2012-12-27 ENCOUNTER — Ambulatory Visit: Payer: Self-pay

## 2014-05-27 ENCOUNTER — Encounter: Payer: Self-pay | Admitting: Obstetrics

## 2017-11-10 ENCOUNTER — Encounter (HOSPITAL_COMMUNITY): Payer: Self-pay | Admitting: *Deleted

## 2017-11-10 ENCOUNTER — Other Ambulatory Visit: Payer: Self-pay

## 2017-11-10 ENCOUNTER — Emergency Department (HOSPITAL_COMMUNITY): Payer: Self-pay

## 2017-11-10 ENCOUNTER — Emergency Department (HOSPITAL_COMMUNITY)
Admission: EM | Admit: 2017-11-10 | Discharge: 2017-11-10 | Disposition: A | Discharge status: Home / Self Care | Payer: Self-pay | Attending: Emergency Medicine | Admitting: Emergency Medicine

## 2017-11-10 DIAGNOSIS — Y999 Unspecified external cause status: Secondary | ICD-10-CM | POA: Insufficient documentation

## 2017-11-10 DIAGNOSIS — W19XXXA Unspecified fall, initial encounter: Secondary | ICD-10-CM | POA: Insufficient documentation

## 2017-11-10 DIAGNOSIS — Z8679 Personal history of other diseases of the circulatory system: Secondary | ICD-10-CM | POA: Insufficient documentation

## 2017-11-10 DIAGNOSIS — S62356A Nondisplaced fracture of shaft of fifth metacarpal bone, right hand, initial encounter for closed fracture: Secondary | ICD-10-CM

## 2017-11-10 DIAGNOSIS — Z79899 Other long term (current) drug therapy: Secondary | ICD-10-CM | POA: Insufficient documentation

## 2017-11-10 DIAGNOSIS — Y939 Activity, unspecified: Secondary | ICD-10-CM | POA: Insufficient documentation

## 2017-11-10 DIAGNOSIS — Y929 Unspecified place or not applicable: Secondary | ICD-10-CM | POA: Insufficient documentation

## 2017-11-10 DIAGNOSIS — S62362A Nondisplaced fracture of neck of third metacarpal bone, right hand, initial encounter for closed fracture: Secondary | ICD-10-CM | POA: Insufficient documentation

## 2017-11-10 DIAGNOSIS — F17210 Nicotine dependence, cigarettes, uncomplicated: Secondary | ICD-10-CM | POA: Insufficient documentation

## 2017-11-10 MED ORDER — HYDROCODONE-ACETAMINOPHEN 5-325 MG PO TABS: 1.0000 | ORAL_TABLET | ORAL | 0 refills | Status: AC | PRN

## 2017-11-10 MED ORDER — ACETAMINOPHEN 325 MG PO TABS
650.0000 mg | ORAL_TABLET | Freq: Once | ORAL | Status: AC
  Administered 2017-11-10: 650 mg via ORAL
  Filled 2017-11-10: qty 2

## 2017-11-10 MED ORDER — HYDROCODONE-ACETAMINOPHEN 5-325 MG PO TABS
2.0000 | ORAL_TABLET | Freq: Once | ORAL | Status: AC
  Administered 2017-11-10: 1 via ORAL
  Filled 2017-11-10: qty 2

## 2017-11-10 NOTE — ED Triage Notes (Signed)
The pt reports that she she fell onto her rt wrist 2 hours ago.  C/o pain lmp 3 weeks ago

## 2017-11-10 NOTE — Progress Notes (Signed)
Orthopedic Tech Progress Note Patient Details:  Jamse ArnLafarra Mone Toman July 07, 1981 [id number]  Ortho Devices Type of Ortho Device: Ace wrap, Arm sling, Sugartong splint Ortho Device/Splint Location: rue Ortho Device/Splint Interventions: Application   Post Interventions Patient Tolerated: Well Instructions Provided: Care of device   Nikki DomCrawford, Jessicaann Overbaugh 11/10/2017, 9:54 AM

## 2017-11-10 NOTE — ED Provider Notes (Signed)
[facility] EMERGENCY DEPARTMENT Provider Note   CSN: [id number] Arrival date & time: 11/10/17  0408     History   Chief Complaint Chief Complaint  Patient presents with  . Wrist Pain    HPI Laurie [name] is a 37 y.o. female.  The history is provided by the patient. No language interpreter was used.  Wrist Pain  This is a new problem. The current episode started yesterday. The problem occurs constantly. Nothing aggravates the symptoms. Nothing relieves the symptoms. The treatment provided no relief.  Pt fell and hit a door   Past Medical History:  Diagnosis Date  . Heart murmur    at birth    Patient Active Problem List   Diagnosis Date Noted  . Cesarean delivery delivered 09/23/2012  . Arrested active phase of labor 09/21/2012  . Postmaturity pregnancy, 40-[redacted] weeks gestation 09/20/2012  . Pediatric pre-birth visit for expectant parent(s) 08/01/2012    Past Surgical History:  Procedure Laterality Date  . CESAREAN SECTION N/A 09/21/2012   Procedure: CESAREAN SECTION;  Surgeon: Antionette Char, MD;  Location: [facility];  Service: Obstetrics;  Laterality: N/A;     OB History    Gravida  2   Para  1   Term  1   Preterm  0   AB  1   Living  1     SAB  1   TAB  0   Ectopic  0   Multiple  0   Live Births  1            Home Medications    Prior to Admission medications   Medication Sig Start Date End Date Taking? Authorizing Provider  HYDROcodone-acetaminophen (NORCO/VICODIN) 5-325 MG tablet Take 1 tablet by mouth every 4 (four) hours as needed. 11/10/17   Elson Areas, PA-C  medroxyPROGESTERone (DEPO-PROVERA) 150 MG/ML injection Inject 1 mL (150 mg total) into the muscle every 3 (three) months. 11/15/12   Brock Bad, MD  Prenatal Vit-Fe Fumarate-FA (MULTIVITAMIN-PRENATAL) 27-0.8 MG TABS Take 1 tablet by mouth daily.    [provider]    Family History Family History  Problem Relation Age of Onset    . Hypertension Mother   . Hyperlipidemia Father   . Hypertension Father   . Anesthesia problems Neg Hx     Social History Social History   Tobacco Use  . Smoking status: Current Every Day Smoker    Packs/day: 0.25    Years: 1.00    Pack years: 0.25    Types: Cigarettes  . Smokeless tobacco: Never Used  . Tobacco comment: went from pack a day, to 1 a day with + preg  Substance Use Topics  . Alcohol use: Yes    Comment: "sometimes"  . Drug use: Yes    Types: Marijuana    Comment: none since Pos preg     Allergies   Patient has no known allergies.   Review of Systems Review of Systems  Musculoskeletal: Positive for joint swelling and myalgias.  All other systems reviewed and are negative.    Physical Exam Updated Vital Signs BP (!) 155/89   Pulse (!) 56   Temp 98.5 F (36.9 C)   Resp 18   Ht 5\' 7"  (1.702 m)   Wt 59 kg (130 lb)   LMP 10/20/2017   BMI 20.36 kg/m   Physical Exam  Constitutional: She appears well-developed and well-nourished.  Musculoskeletal: She exhibits tenderness and deformity.  Swollen  tender right 5th hand,  Pain with movement,  nv and ns intact  Neurological: She is alert.  Skin: Skin is warm.  Psychiatric: She has a normal mood and affect.  Nursing note and vitals reviewed.    ED Treatments / Results  Labs (all labs ordered are listed, but only abnormal results are displayed) Labs Reviewed - No data to display  EKG None  Radiology Dg Wrist Complete Right  Result Date: 11/10/2017 CLINICAL DATA:  Hit a door frame while falling, with right medial wrist pain and pain at the fifth metacarpal. Initial encounter. EXAM: RIGHT WRIST - COMPLETE 3+ VIEW COMPARISON:  None. FINDINGS: There is a mildly comminuted mildly displaced fracture at the proximal diaphysis of the fifth metacarpal, with likely intra-articular extension to the carpometacarpal joint. A small ulnar styloid fracture is noted. The carpal rows are intact, and demonstrate  normal alignment. The joint spaces are otherwise preserved. Soft tissue swelling is noted about the ulnar aspect of the wrist and fifth metacarpal. IMPRESSION: 1. Mildly comminuted mildly displaced fracture at the proximal diaphysis of the fifth metacarpal, with likely intra-articular extension to the carpometacarpal joint. 2. Small ulnar styloid fracture noted. Electronically Signed   By: Roanna RaiderJeffery  Chang M.D.   On: 11/10/2017 05:33    Procedures Procedures (including critical care time)  Medications Ordered in ED Medications  HYDROcodone-acetaminophen (NORCO/VICODIN) 5-325 MG per tablet 2 tablet (has no administration in time range)  acetaminophen (TYLENOL) tablet 650 mg (650 mg Oral Given 11/10/17 0433)     Initial Impression / Assessment and Plan / ED Course  I have reviewed the triage vital signs and the nursing notes.  Pertinent labs & imaging results that were available during my care of the patient were reviewed by me and considered in my medical decision making (see chart for details).     MDM  Pt counseled on splint care and fracture.  Pt advised to follow up with Dr. Izora Ribasoley for recheck in 3-4 days.  Final Clinical Impressions(s) / ED Diagnoses   Final diagnoses:  Closed nondisplaced fracture of shaft of fifth metacarpal bone of right hand, initial encounter    ED Discharge Orders        Ordered    HYDROcodone-acetaminophen (NORCO/VICODIN) 5-325 MG tablet  Every 4 hours PRN     11/10/17 0854    An After Visit Summary was printed and given to the patient.    Elson AreasSofia, Aubryana Vittorio K, PA-C 11/10/17 [id number]    Shaune PollackIsaacs, Cameron, MD 11/10/17 [phone]

## 2017-11-10 NOTE — ED Notes (Signed)
Pt only wanted to take one Vicodin, second one returned

## 2017-11-22 ENCOUNTER — Encounter (INDEPENDENT_AMBULATORY_CARE_PROVIDER_SITE_OTHER): Payer: Self-pay | Admitting: Orthopaedic Surgery

## 2017-11-22 ENCOUNTER — Ambulatory Visit (INDEPENDENT_AMBULATORY_CARE_PROVIDER_SITE_OTHER): Payer: PRIVATE HEALTH INSURANCE | Admitting: Orthopaedic Surgery

## 2017-11-22 ENCOUNTER — Ambulatory Visit (INDEPENDENT_AMBULATORY_CARE_PROVIDER_SITE_OTHER): Payer: PRIVATE HEALTH INSURANCE

## 2017-11-22 DIAGNOSIS — S62326A Displaced fracture of shaft of fifth metacarpal bone, right hand, initial encounter for closed fracture: Secondary | ICD-10-CM

## 2017-11-22 DIAGNOSIS — S52611A Displaced fracture of right ulna styloid process, initial encounter for closed fracture: Secondary | ICD-10-CM | POA: Diagnosis not present

## 2017-11-22 NOTE — Progress Notes (Signed)
Office Visit Note   Patient: Laurie [name]           Date of Birth: 01-17-1981           MRN: [id number] Visit Date: 11/22/2017              Requested by: Brock Bad, MD [address] [place], [place] [address] PCP: Brock Bad, MD   Assessment & Plan: Visit Diagnoses:  1. Closed displaced fracture of shaft of fifth metacarpal bone of right hand, initial encounter   2. Displaced fracture of right ulna styloid process, initial encounter for closed fracture     Plan: At this point, believe her fracture will be amenable to conservative treatment.  We will place her in an ulnar gutter cast nonweightbearing.  She will follow-up with [place] in 3 weeks time for repeat evaluation x-ray.  Call with concerns or questions in the meantime.  Follow-Up Instructions: Return in about 3 weeks (around 12/13/2017).   Orders:  Orders Placed This Encounter  Procedures  . XR Wrist Complete Right   No orders of the defined types were placed in this encounter.     Procedures: No procedures performed   Clinical Data: No additional findings.   Subjective: Chief Complaint  Patient presents with  . Right Hand - Injury  . Right Wrist - Injury    HPI patient is a pleasant 37 year old female who presents to our clinic today for follow-up of an injury to her right wrist.  On 11/10/2017, she saw her right arm back hitting her hand on a door frame.  She was seen in the ED where x-rays were obtained.  The showed an ulnar styloid fracture as well as a mildly comminuted and mildly displaced proximal fifth metacarpal fracture.  She is placed in ulnar gutter splint.  She has been elevating for swelling.  Pain is improved but is still persistent.  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  Review of Systems as detailed in HPI.  All others reviewed and are negative.   Objective: Vital Signs: There were no vitals taken for this visit.  Physical Exam  well-developed well-nourished female no acute distress.  Alert and oriented x3.  Ortho Exam examination of her right hand reveals minimal swelling.  Moderate tenderness over the proximal fifth metacarpal on ulnar styloid.  Minimal rotation to the fifth finger.  She is neurovascularly intact distally.  Specialty Comments:  No specialty comments available.  Imaging: Xr Wrist Complete Right  Result Date: 11/22/2017 X-rays show a mildly comminuted and displaced proximal fifth metacarpal fracture and ulnar styloid fracture    PMFS History: Patient Active Problem List   Diagnosis Date Noted  . Closed displaced fracture of shaft of fifth metacarpal bone of right hand 11/22/2017  . Displaced fracture of right ulna styloid process, initial encounter for closed fracture 11/22/2017  . Cesarean delivery delivered 09/23/2012  . Arrested active phase of labor 09/21/2012  . Postmaturity pregnancy, 40-[redacted] weeks gestation 09/20/2012  . Pediatric pre-birth visit for expectant parent(s) 08/01/2012   Past Medical History:  Diagnosis Date  . Heart murmur    at birth    Family History  Problem Relation Age of Onset  . Hypertension Mother   . Hyperlipidemia Father   . Hypertension Father   . Anesthesia problems Neg Hx     Past Surgical History:  Procedure Laterality Date  . CESAREAN SECTION N/A 09/21/2012   Procedure: CESAREAN SECTION;  Surgeon: Antionette Char, MD;  Location: [facility];  Service: Obstetrics;  Laterality: N/A;   Social History   Occupational History  . Occupation: Hair Stylist  Tobacco Use  . Smoking status: Current Every Day Smoker    Packs/day: 0.25    Years: 1.00    Pack years: 0.25    Types: Cigarettes  . Smokeless tobacco: Never Used  . Tobacco comment: went from pack a day, to 1 a day with + preg  Substance and Sexual Activity  . Alcohol use: Yes    Comment: "sometimes"  . Drug use: Yes    Types: Marijuana    Comment: none since Pos preg  . Sexual activity:  Yes    Partners: Male    Birth control/protection: Injection

## 2017-12-06 ENCOUNTER — Encounter (INDEPENDENT_AMBULATORY_CARE_PROVIDER_SITE_OTHER): Payer: Self-pay | Admitting: Orthopaedic Surgery

## 2017-12-06 ENCOUNTER — Ambulatory Visit (INDEPENDENT_AMBULATORY_CARE_PROVIDER_SITE_OTHER): Payer: PRIVATE HEALTH INSURANCE

## 2017-12-06 ENCOUNTER — Ambulatory Visit (INDEPENDENT_AMBULATORY_CARE_PROVIDER_SITE_OTHER): Payer: PRIVATE HEALTH INSURANCE | Admitting: Orthopaedic Surgery

## 2017-12-06 DIAGNOSIS — S62326A Displaced fracture of shaft of fifth metacarpal bone, right hand, initial encounter for closed fracture: Secondary | ICD-10-CM | POA: Diagnosis not present

## 2017-12-06 NOTE — Progress Notes (Signed)
Office Visit Note   Patient: Laurie [name]           Date of Birth: 01-20-1981           MRN: [id number] Visit Date: 12/06/2017              Requested by: Brock Bad, MD [address] [place], [place] [address] PCP: Brock Bad, MD   Assessment & Plan: Visit Diagnoses:  1. Closed displaced fracture of shaft of fifth metacarpal bone of right hand, initial encounter     Plan: At this point, we will refer the patient to hand therapy for a thermoplastic removable ulnar gutter splint as well as to start working on gentle range of motion.  She will follow-up with [place] in 4 weeks time for repeat evaluation and x-ray.  Call with concerns or questions in the meantime  Follow-Up Instructions: Return in about 1 month (around 01/03/2018).   Orders:  Orders Placed This Encounter  Procedures  . XR Wrist Complete Right   No orders of the defined types were placed in this encounter.     Procedures: No procedures performed   Clinical Data: No additional findings.   Subjective: Chief Complaint  Patient presents with  . Right Hand - Pain    HPI patient is a pleasant 37 year old female who presents to our clinic today for follow-up of her right displaced midshaft fifth metacarpal fracture as well as ulnar styloid fracture.  Date of injury, 11/10/2017.  She has been compliant in an ulnar gutter splint.  She does note a pulsing ache.  No numbness, tingling or burning.  Review of Systems as detailed in HPI.  All others reviewed and are negative.   Objective: Vital Signs: There were no vitals taken for this visit.  Physical Exam well-developed well-nourished female no acute distress.  Alert and oriented x3.  Ortho Exam examination of the right hand shows minimal tenderness to palpation.  Minimal swelling.  She is neurovascularly intact distally.  Specialty Comments:  No specialty comments available.  Imaging: Xr Wrist Complete Right  Result Date:  12/06/2017 X-rays show stable alignment of the fracture with good callus formation    PMFS History: Patient Active Problem List   Diagnosis Date Noted  . Closed displaced fracture of shaft of fifth metacarpal bone of right hand 11/22/2017  . Displaced fracture of right ulna styloid process, initial encounter for closed fracture 11/22/2017  . Cesarean delivery delivered 09/23/2012  . Arrested active phase of labor 09/21/2012  . Postmaturity pregnancy, 40-[redacted] weeks gestation 09/20/2012  . Pediatric pre-birth visit for expectant parent(s) 08/01/2012   Past Medical History:  Diagnosis Date  . Heart murmur    at birth    Family History  Problem Relation Age of Onset  . Hypertension Mother   . Hyperlipidemia Father   . Hypertension Father   . Anesthesia problems Neg Hx     Past Surgical History:  Procedure Laterality Date  . CESAREAN SECTION N/A 09/21/2012   Procedure: CESAREAN SECTION;  Surgeon: Antionette Char, MD;  Location: [facility];  Service: Obstetrics;  Laterality: N/A;   Social History   Occupational History  . Occupation: Hair Stylist  Tobacco Use  . Smoking status: Current Every Day Smoker    Packs/day: 0.25    Years: 1.00    Pack years: 0.25    Types: Cigarettes  . Smokeless tobacco: Never Used  . Tobacco comment: went from pack a day, to 1 a  day with + preg  Substance and Sexual Activity  . Alcohol use: Yes    Comment: "sometimes"  . Drug use: Yes    Types: Marijuana    Comment: none since Pos preg  . Sexual activity: Yes    Partners: Male    Birth control/protection: Injection

## 2018-01-03 ENCOUNTER — Ambulatory Visit (INDEPENDENT_AMBULATORY_CARE_PROVIDER_SITE_OTHER): Payer: PRIVATE HEALTH INSURANCE | Admitting: Orthopaedic Surgery

## 2018-05-30 ENCOUNTER — Emergency Department (HOSPITAL_COMMUNITY): Payer: No Typology Code available for payment source

## 2018-05-30 ENCOUNTER — Emergency Department (HOSPITAL_COMMUNITY)
Admission: EM | Admit: 2018-05-30 | Discharge: 2018-05-30 | Disposition: A | Discharge status: Home / Self Care | Payer: No Typology Code available for payment source | Attending: Emergency Medicine | Admitting: Emergency Medicine

## 2018-05-30 DIAGNOSIS — Z79899 Other long term (current) drug therapy: Secondary | ICD-10-CM | POA: Insufficient documentation

## 2018-05-30 DIAGNOSIS — Z041 Encounter for examination and observation following transport accident: Secondary | ICD-10-CM | POA: Diagnosis not present

## 2018-05-30 DIAGNOSIS — F17210 Nicotine dependence, cigarettes, uncomplicated: Secondary | ICD-10-CM | POA: Insufficient documentation

## 2018-05-30 LAB — POC URINE PREG, ED: Preg Test, Ur: NEGATIVE

## 2018-05-30 MED ORDER — IBUPROFEN 800 MG PO TABS
800.0000 mg | ORAL_TABLET | Freq: Once | ORAL | Status: AC
  Administered 2018-05-30: 800 mg via ORAL
  Filled 2018-05-30: qty 1
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
## 2018-05-30 NOTE — ED Notes (Signed)
Patient transported to X-ray 

## 2018-05-30 NOTE — ED Notes (Signed)
Patient Alert and oriented to baseline. Stable and ambulatory to baseline. Patient verbalized understanding of the discharge instructions.  Patient belongings were taken by the patient.   
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
## 2018-05-30 NOTE — Discharge Instructions (Addendum)
You may alternate Tylenol 1000 mg every 6 hours as needed for pain and Ibuprofen 800 mg every 8 hours as needed for pain.  Please take Ibuprofen with food.  Your x-rays today were negative for any acute abnormalities.

## 2019-03-22 IMAGING — CR DG WRIST COMPLETE 3+V*R*
4 series · 4 of 4 positions shown · non-contrast
Comparison: None.

CLINICAL DATA: Hit a door frame while falling, with right medial
wrist pain and pain at the fifth metacarpal. Initial encounter.

EXAM:
RIGHT WRIST - COMPLETE 3+ VIEW

[wrist pa]
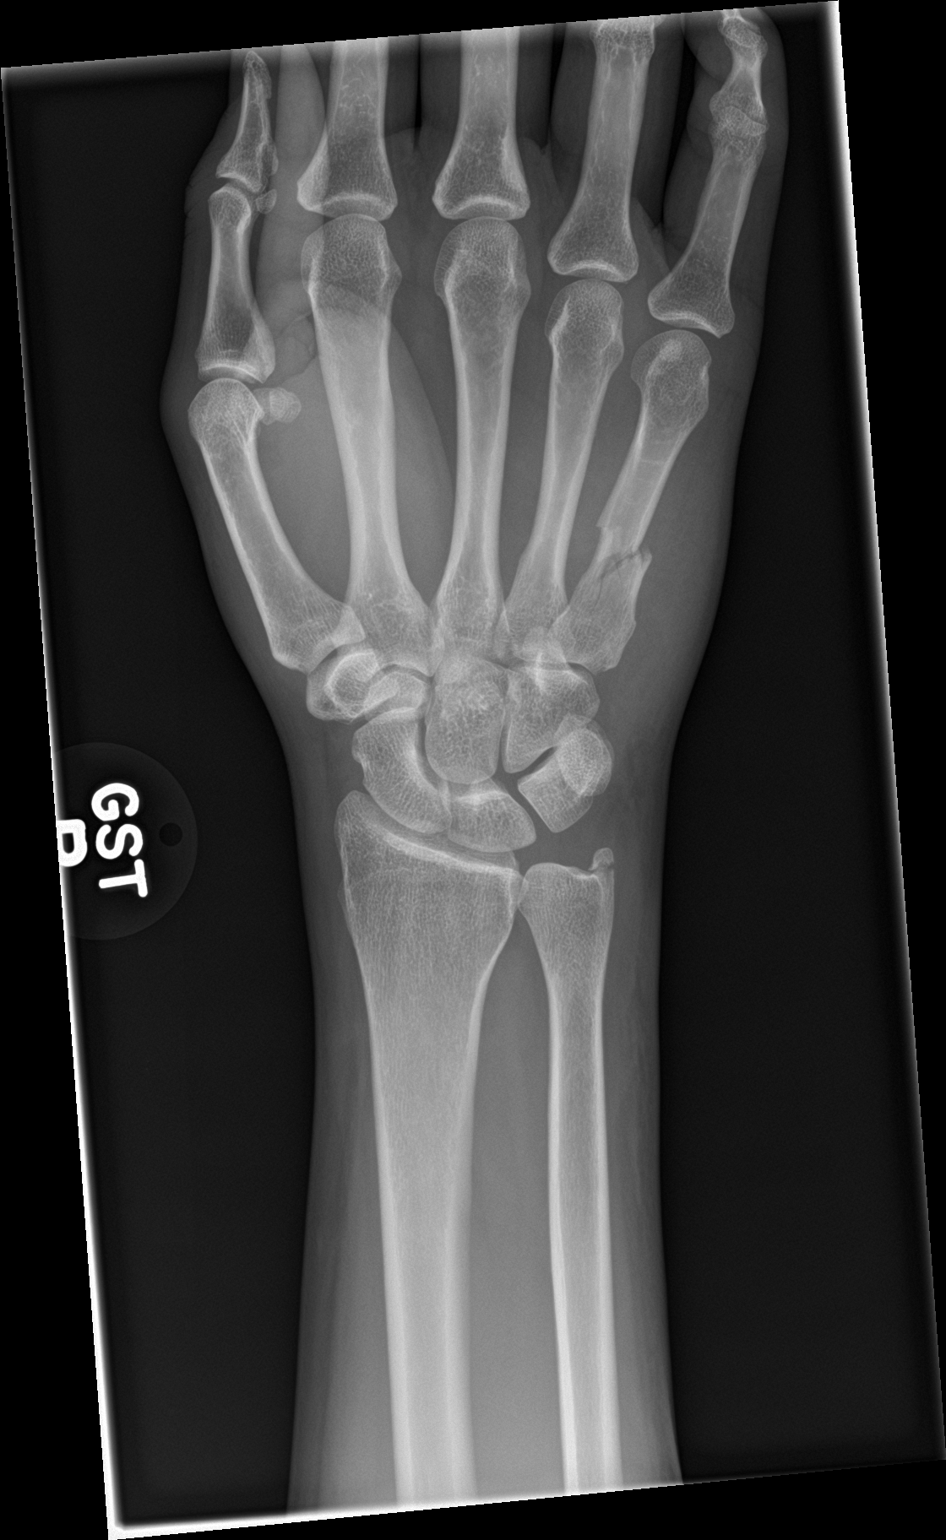

[wrist obl]
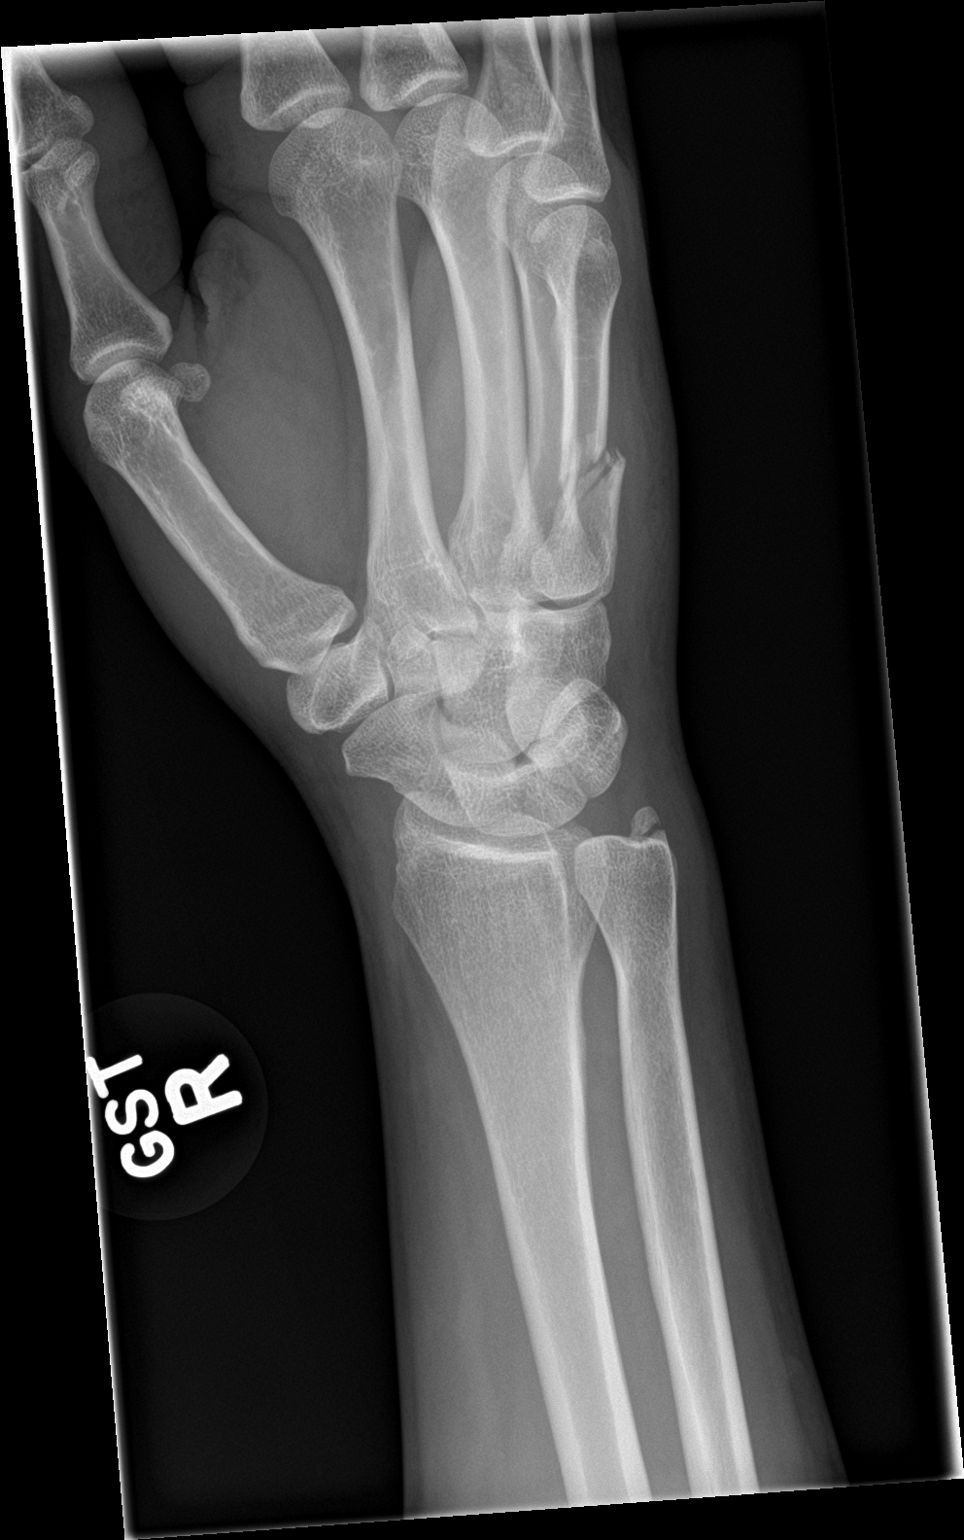

[wrist lat]
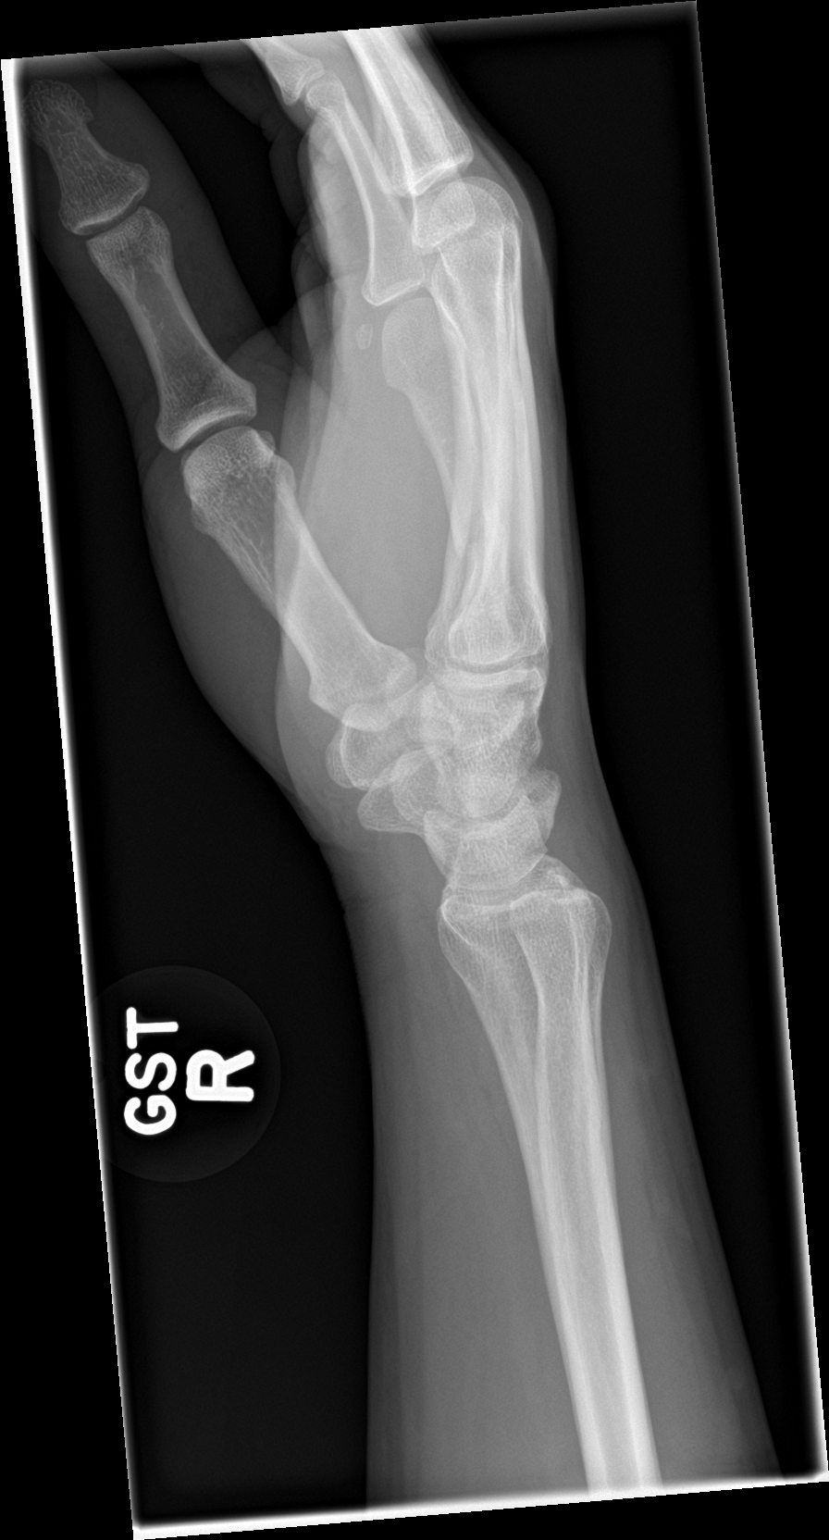

[wrist navicular]
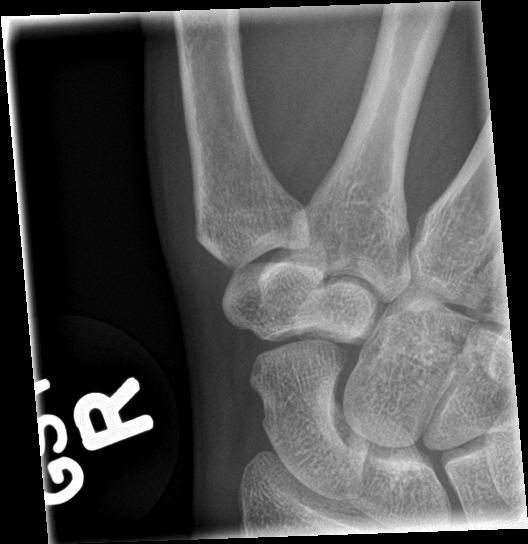

[4 of 4 positions shown; findings below may reference images not displayed]

FINDINGS: There is a mildly comminuted mildly displaced fracture at the
proximal diaphysis of the fifth metacarpal, with likely
intra-articular extension to the carpometacarpal joint. A small
ulnar styloid fracture is noted.

The carpal rows are intact, and demonstrate normal alignment. The
joint spaces are otherwise preserved.

Soft tissue swelling is noted about the ulnar aspect of the wrist
and fifth metacarpal.
IMPRESSION: 1. Mildly comminuted mildly displaced fracture at the proximal
diaphysis of the fifth metacarpal, with likely intra-articular
extension to the carpometacarpal joint.
2. Small ulnar styloid fracture noted.

## 2019-07-31 ENCOUNTER — Ambulatory Visit: Payer: Self-pay | Attending: Internal Medicine

## 2019-07-31 DIAGNOSIS — Z20822 Contact with and (suspected) exposure to covid-19: Secondary | ICD-10-CM

## 2019-08-02 LAB — NOVEL CORONAVIRUS, NAA: SARS-CoV-2, NAA: NOT DETECTED
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
## 2021-10-24 ENCOUNTER — Ambulatory Visit (HOSPITAL_COMMUNITY)
Admission: EM | Admit: 2021-10-24 | Discharge: 2021-10-24 | Disposition: A | Discharge status: Home / Self Care | Payer: Self-pay | Attending: Urgent Care | Admitting: Urgent Care

## 2021-10-24 ENCOUNTER — Encounter (HOSPITAL_COMMUNITY): Payer: Self-pay | Admitting: Urgent Care

## 2021-10-24 DIAGNOSIS — Z23 Encounter for immunization: Secondary | ICD-10-CM

## 2021-10-24 DIAGNOSIS — S61213A Laceration without foreign body of left middle finger without damage to nail, initial encounter: Secondary | ICD-10-CM

## 2021-10-24 DIAGNOSIS — Z789 Other specified health status: Secondary | ICD-10-CM

## 2021-10-24 MED ORDER — TETANUS-DIPHTH-ACELL PERTUSSIS 5-2.5-18.5 LF-MCG/0.5 IM SUSY
PREFILLED_SYRINGE | INTRAMUSCULAR | Status: AC
  Filled 2021-10-24: qty 0.5

## 2021-10-24 MED ORDER — TETANUS-DIPHTH-ACELL PERTUSSIS 5-2.5-18.5 LF-MCG/0.5 IM SUSY
0.5000 mL | PREFILLED_SYRINGE | Freq: Once | INTRAMUSCULAR | Status: AC
  Administered 2021-10-24: 0.5 mL via INTRAMUSCULAR

## 2021-10-24 NOTE — Discharge Instructions (Addendum)
Your Tdap (tetanus) vaccine was updated today. ?Your cuts were closed with Dermabond, tissue adhesive ?Please follow the wound care instructions handout. ?Please wear the finger splint for a minimum of 5 days to prevent any excessive tension on the wound as it heals. ?Monitor for warmth, redness, or drainage which may indicate infection. ?

## 2021-10-24 NOTE — ED Triage Notes (Signed)
Pt reports at work she cut her left middle finger with a straight razor. Pt last tetanus was in 2014 ?

## 2021-10-24 NOTE — ED Provider Notes (Signed)
?[facility] ? ? ? ?CSN: [id number] ?Arrival date & time: 10/24/21  1343 ? ? ?  ? ?History   ?Chief Complaint ?Chief Complaint  ?Patient presents with  ? Laceration  ? ? ?HPI ?Laurie [name] is a 41 y.o. female.  ? ?Pleasant 41 year old female presents today due to concerns of 2 small simple lacerations to her third digit posteriorly on her left hand.  States this occurred roughly 1 hour prior to presentation while at work.  She is a Interior and spatial designer.  She was using a straight blade and cut her finger.  Came in because she stated she had a hard time stopping the bleeding.  She is not on blood thinners.  She has full sensation to her distal fingertip.  She denies foreign bodies.  She is not up-to-date on her tetanus vaccination. ? ? ?Laceration ? ?Past Medical History:  ?Diagnosis Date  ? Heart murmur   ? at birth  ? ? ?Patient Active Problem List  ? Diagnosis Date Noted  ? Closed displaced fracture of shaft of fifth metacarpal bone of right hand 11/22/2017  ? Displaced fracture of right ulna styloid process, initial encounter for closed fracture 11/22/2017  ? Cesarean delivery delivered 09/23/2012  ? Arrested active phase of labor 09/21/2012  ? Postmaturity pregnancy, 40-[redacted] weeks gestation 09/20/2012  ? Pediatric pre-birth visit for expectant parent(s) 08/01/2012  ? ? ?Past Surgical History:  ?Procedure Laterality Date  ? CESAREAN SECTION N/A 09/21/2012  ? Procedure: CESAREAN SECTION;  Surgeon: Antionette Char, MD;  Location: [facility];  Service: Obstetrics;  Laterality: N/A;  ? ? ?OB History   ? ? Gravida  ?2  ? Para  ?1  ? Term  ?1  ? Preterm  ?0  ? AB  ?1  ? Living  ?1  ?  ? ? SAB  ?1  ? IAB  ?0  ? Ectopic  ?0  ? Multiple  ?0  ? Live Births  ?1  ?   ?  ?  ? ? ? ?Home Medications   ? ?Prior to Admission medications   ?Medication Sig Start Date End Date Taking? Authorizing Provider  ?HYDROcodone-acetaminophen (NORCO/VICODIN) 5-325 MG tablet Take 1 tablet by mouth every 4 (four) hours as needed. 11/10/17    Elson Areas, PA-C  ?medroxyPROGESTERone (DEPO-PROVERA) 150 MG/ML injection Inject 1 mL (150 mg total) into the muscle every 3 (three) months. 11/15/12   Brock Bad, MD  ?Prenatal Vit-Fe Fumarate-FA (MULTIVITAMIN-PRENATAL) 27-0.8 MG TABS Take 1 tablet by mouth daily.    [provider]  ? ? ?Family History ?Family History  ?Problem Relation Age of Onset  ? Hypertension Mother   ? Hyperlipidemia Father   ? Hypertension Father   ? Anesthesia problems Neg Hx   ? ? ?Social History ?Social History  ? ?Tobacco Use  ? Smoking status: Every Day  ?  Packs/day: 0.25  ?  Years: 1.00  ?  Pack years: 0.25  ?  Types: Cigarettes  ? Smokeless tobacco: Never  ? Tobacco comments:  ?  went from pack a day, to 1 a day with + preg  ?Substance Use Topics  ? Alcohol use: Yes  ?  Comment: "sometimes"  ? Drug use: Yes  ?  Types: Marijuana  ?  Comment: none since Pos preg  ? ? ? ?Allergies   ?Patient has no known allergies. ? ? ?Review of Systems ?Review of Systems  ?Skin:  Positive for wound.  ?All other systems reviewed and  are negative. ? ? ?Physical Exam ?Triage Vital Signs ?ED Triage Vitals  ?Enc Vitals Group  ?   BP 10/24/21 1528 (!) 164/94  ?   Pulse Rate 10/24/21 1528 64  ?   Resp 10/24/21 1528 17  ?   Temp 10/24/21 1528 98.3 ?F (36.8 ?C)  ?   Temp Source 10/24/21 1528 Oral  ?   SpO2 10/24/21 1528 97 %  ?   Weight --   ?   Height --   ?   Head Circumference --   ?   Peak Flow --   ?   Pain Score 10/24/21 1527 0  ?   Pain Loc --   ?   Pain Edu? --   ?   Excl. in GC? --   ? ?No data found. ? ?Updated Vital Signs ?BP (!) 157/99 (BP Location: Right Arm)   Pulse 64   Temp 98.3 ?F (36.8 ?C) (Oral)   Resp 17   LMP 10/15/2021   SpO2 97%  ? ?Visual Acuity ?Right Eye Distance:   ?Left Eye Distance:   ?Bilateral Distance:   ? ?Right Eye Near:   ?Left Eye Near:    ?Bilateral Near:    ? ?Physical Exam ?Vitals and nursing note reviewed.  ?Constitutional:   ?   General: She is not in acute distress. ?   Appearance: Normal  appearance. She is normal weight. She is not ill-appearing, toxic-appearing or diaphoretic.  ?HENT:  ?   Head: Normocephalic.  ?Musculoskeletal:     ?   General: Signs of injury (2 simple lacerations to 3rd digit, dorsal aspect left hand) present. No swelling, tenderness or deformity. Normal range of motion.  ?   Right lower leg: No edema.  ?   Left lower leg: No edema.  ?   Comments: Pt had 2 simple, well approximated lacerations to her L 3rd digit posteriorly. The first one was 5mm in length over the PIP joint. The second was 4mm in length over the DIP joint. NEUROVASCULARLY INTACT, FROM digit, normal sensation, NO FB  ?Skin: ?   General: Skin is warm.  ?   Capillary Refill: Capillary refill takes less than 2 seconds.  ?   Findings: No erythema or rash.  ?Neurological:  ?   General: No focal deficit present.  ?   Mental Status: She is alert and oriented to person, place, and time.  ?   Sensory: No sensory deficit.  ?   Motor: No weakness.  ? ? ? ?UC Treatments / Results  ?Labs ?(all labs ordered are listed, but only abnormal results are displayed) ?Labs Reviewed - No data to display ? ?EKG ? ? ?Radiology ?No results found. ? ?Procedures ?Laceration Repair ? ?Date/Time: 10/24/2021 4:29 PM ?Performed by: Maretta Beesrain, Dula Havlik L, PA ?Authorized by: Maretta Beesrain, Gumaro Brightbill L, PA  ? ?Consent:  ?  Consent obtained:  Verbal ?  Consent given by:  Patient ?  Risks, benefits, and alternatives were discussed: yes   ?  Risks discussed:  Infection, pain, poor cosmetic result, retained foreign body and poor wound healing ?  Alternatives discussed:  No treatment and observation ?Universal protocol:  ?  Patient identity confirmed:  Verbally with patient ?Anesthesia:  ?  Anesthesia method:  None ?Laceration details:  ?  Location:  Finger ?  Finger location:  L long finger ?  Wound length (cm): 4mm to DIP, 5mm to PIP. ?Treatment:  ?  Area cleansed with:  Povidone-iodine ?  Amount of  cleaning:  Standard ?  Irrigation solution:  Sterile saline ?Skin  repair:  ?  Repair method:  Tissue adhesive ?Approximation:  ?  Approximation:  Close ?Post-procedure details:  ?  Dressing:  Splint for protection ?  Procedure completion:  Tolerated (including critical care time) ? ?Medications Ordered in UC ?Medications  ?Tdap (BOOSTRIX) injection 0.5 mL (0.5 mLs Intramuscular Given 10/24/21 1615)  ? ? ?Initial Impression / Assessment and Plan / UC Course  ?I have reviewed the triage vital signs and the nursing notes. ? ?Pertinent labs & imaging results that were available during my care of the patient were reviewed by me and considered in my medical decision making (see chart for details). ? ?  ? ?Laceration to 3rd digit on L hand - closed in office with dermabond. Wound care instructions provided. Finger splint applied to prevent excessive ROM of finger. Must wear splint minimum of 5 days. S/sx of infection discussed. ?Tdap - updated in clinic today. ?Final Clinical Impressions(s) / UC Diagnoses  ? ?Final diagnoses:  ?Laceration of left middle finger without foreign body without damage to nail, initial encounter  ?Not up to date with diphtheria-tetanus vaccination  ? ? ? ?Discharge Instructions   ? ?  ?Your Tdap (tetanus) vaccine was updated today. ?Your cuts were closed with Dermabond, tissue adhesive ?Please follow the wound care instructions handout. ?Please wear the finger splint for a minimum of 5 days to prevent any excessive tension on the wound as it heals. ?Monitor for warmth, redness, or drainage which may indicate infection. ? ? ? ? ?ED Prescriptions   ?None ?  ? ?PDMP not reviewed this encounter. ?  Maretta Bees, [place] ?10/24/21 1633 ? ?

## 2023-02-07 ENCOUNTER — Other Ambulatory Visit: Payer: Self-pay

## 2023-02-07 ENCOUNTER — Emergency Department (HOSPITAL_BASED_OUTPATIENT_CLINIC_OR_DEPARTMENT_OTHER)
Admission: EM | Admit: 2023-02-07 | Discharge: 2023-02-07 | Disposition: A | Discharge status: Home / Self Care | Payer: Self-pay | Attending: Emergency Medicine | Admitting: Emergency Medicine

## 2023-02-07 ENCOUNTER — Emergency Department (HOSPITAL_BASED_OUTPATIENT_CLINIC_OR_DEPARTMENT_OTHER): Payer: Self-pay

## 2023-02-07 DIAGNOSIS — R109 Unspecified abdominal pain: Secondary | ICD-10-CM | POA: Insufficient documentation

## 2023-02-07 DIAGNOSIS — N939 Abnormal uterine and vaginal bleeding, unspecified: Secondary | ICD-10-CM

## 2023-02-07 DIAGNOSIS — N938 Other specified abnormal uterine and vaginal bleeding: Secondary | ICD-10-CM | POA: Insufficient documentation

## 2023-02-07 LAB — BASIC METABOLIC PANEL
Anion gap: 7 (ref 5–15)
BUN: 10 mg/dL (ref 6–20)
CO2: 22 mmol/L (ref 22–32)
Calcium: 8.6 mg/dL — ABNORMAL LOW (ref 8.9–10.3)
Chloride: 108 mmol/L (ref 98–111)
Creatinine, Ser: 0.82 mg/dL (ref 0.44–1.00)
GFR, Estimated: >60 mL/min (ref >60)
Glucose, Bld: 106 mg/dL — ABNORMAL HIGH (ref 70–99)
Potassium: 3.4 mmol/L — ABNORMAL LOW (ref 3.5–5.1)
Sodium: 137 mmol/L (ref 135–145)

## 2023-02-07 LAB — CBC
HCT: 37.9 % (ref 36.0–46.0)
Hemoglobin: 12.3 g/dL (ref 12.0–15.0)
MCH: 26.4 pg (ref 26.0–34.0)
MCHC: 32.5 g/dL (ref 30.0–36.0)
MCV: 81.3 fL (ref 80.0–100.0)
Platelets: 251 10*3/uL (ref 150–400)
RBC: 4.66 MIL/uL (ref 3.87–5.11)
RDW: 14.3 % (ref 11.5–15.5)
WBC: 4.3 10*3/uL (ref 4.0–10.5)
nRBC: 0 % (ref 0.0–0.2)

## 2023-02-07 LAB — PREGNANCY, URINE: Preg Test, Ur: NEGATIVE

## 2023-02-07 NOTE — ED Triage Notes (Signed)
Patient presents to ED via POV from home. Here with vaginal bleeding x 3 weeks. Endorses lower abdominal pain.

## 2023-02-07 NOTE — ED Provider Notes (Signed)
[facility] Provider Note   CSN: [id number] Arrival date & time: 02/07/23  1502     History  Chief Complaint  Patient presents with   Vaginal Bleeding    Laurie [name] is a 42 y.o. female.   Vaginal Bleeding Patient presents with vaginal bleeding.  Has had for around 3 weeks.  However has had heavier bleeding since April.  States she saw [facility] and was given some low-dose hormones.  States it improved but increased.  States she is always had heavy menses with the menses lasting about 10 days.  Now however passing blood and has been passing clots.  No real pain.  Does not think she is pregnant.  States she does not have a gynecologist.  States she does feel fatigued.     Home Medications Prior to Admission medications   Medication Sig Start Date End Date Taking? Authorizing Provider  HYDROcodone-acetaminophen (NORCO/VICODIN) 5-325 MG tablet Take 1 tablet by mouth every 4 (four) hours as needed. 11/10/17   Elson Areas, PA-C  medroxyPROGESTERone (DEPO-PROVERA) 150 MG/ML injection Inject 1 mL (150 mg total) into the muscle every 3 (three) months. 11/15/12   Brock Bad, MD  Prenatal Vit-Fe Fumarate-FA (MULTIVITAMIN-PRENATAL) 27-0.8 MG TABS Take 1 tablet by mouth daily.    [provider]      Allergies    Patient has no known allergies.    Review of Systems   Review of Systems  Genitourinary:  Positive for vaginal bleeding.    Physical Exam Updated Vital Signs BP (!) 143/102 (BP Location: Left Arm)   Pulse 63   Temp 97.8 F (36.6 C)   Resp 18   Ht 5\' 7"  (1.702 m)   Wt 69.1 kg   SpO2 100%   BMI 23.87 kg/m  Physical Exam Vitals and nursing note reviewed.  HENT:     Head: Atraumatic.  Cardiovascular:     Rate and Rhythm: Regular rhythm.  Abdominal:     Tenderness: There is abdominal tenderness.     Comments: Mild suprapubic tenderness.  Musculoskeletal:        General: No  tenderness.  Skin:    Coloration: Skin is not pale.  Neurological:     Mental Status: She is alert.     ED Results / Procedures / Treatments   Labs (all labs ordered are listed, but only abnormal results are displayed) Labs Reviewed  BASIC METABOLIC PANEL - Abnormal; Notable for the following components:      Result Value   Potassium 3.4 (*)    Glucose, Bld 106 (*)    Calcium 8.6 (*)    All other components within normal limits  PREGNANCY, URINE  CBC    EKG None  Radiology US PELVIC COMPLETE WITH TRANSVAGINAL  Result Date: 02/07/2023 CLINICAL DATA:  Vaginal bleeding for 3 weeks. EXAM: TRANSABDOMINAL AND TRANSVAGINAL ULTRASOUND OF PELVIS TECHNIQUE: Both transabdominal and transvaginal ultrasound examinations of the pelvis were performed. Transabdominal technique was performed for global imaging of the pelvis including uterus, ovaries, adnexal regions, and pelvic cul-de-sac. It was necessary to proceed with endovaginal exam following the transabdominal exam to visualize the endometrium. COMPARISON:  None Available. FINDINGS: Uterus Measurements: 7.8 x 4.3 x 5.8 cm = volume: 102 mL. Intramural fibroid measures 1.6 x 1.0 x 1.2 cm. Endometrium Poorly defined.  No focal abnormality visualized. Right ovary Not definitely seen. There is a large but simple appearing cyst in the right adnexa measuring 7.7  x 6.8 x 8.4 cm Left ovary Measurements: 3.6 x 1.7 x 3.0 cm = volume: 9.2 mL. Normal appearance/no adnexal mass. Other findings Trace free fluid. IMPRESSION: Indistinct appearance of the endometrium. Given patient's symptoms, recommend further evaluation with pelvic MRI. Large simple appearing right adnexal cyst. Normal appearance of the left ovary. Electronically Signed   By: Ted Mcalpine M.D.   On: 02/07/2023 17:10    Procedures Procedures    Medications Ordered in ED Medications - No data to display  ED Course/ Medical Decision Making/ A&P                             Medical  Decision Making Amount and/or Complexity of Data Reviewed Labs: ordered. Radiology: ordered.   Patient with vaginal bleeding.  Fatigue.  Differential diagnose includes pregnancy, dysfunctional bleeding anemia.  Will do pelvic exam.  Will get pregnancy test and hemoglobin.  Will get ultrasound also to evaluate.  Patient not pregnant.  Blood work reassuring.  Ultrasound showed indistinct endometrium.  Discussed with patient and will defer pelvic exam for now.  Will need GYN follow-up regardless.  Potential need MRI.  Given information for the women's clinic both here and [facility] the [facility].  Will discharge home.        Final Clinical Impression(s) / ED Diagnoses Final diagnoses:  Vaginal bleeding    Rx / DC Orders ED Discharge Orders     None         Benjiman Core, MD 02/07/23 1737

## 2023-02-07 NOTE — Discharge Instructions (Addendum)
Follow-up with one of the women's clinics for further workup of your vaginal bleeding.

## 2023-03-08 ENCOUNTER — Ambulatory Visit (INDEPENDENT_AMBULATORY_CARE_PROVIDER_SITE_OTHER): Payer: Self-pay | Admitting: Medical

## 2023-03-08 ENCOUNTER — Encounter: Payer: Self-pay | Admitting: Medical

## 2023-03-08 VITALS — BP 123/82 | HR 60 | Ht 67.0 in | Wt 147.0 lb

## 2023-03-08 DIAGNOSIS — N939 Abnormal uterine and vaginal bleeding, unspecified: Secondary | ICD-10-CM

## 2023-03-08 NOTE — Progress Notes (Signed)
Patient presented as ED follow-up for AUB. Since ED visit has been seen and is being managed for AUB by [facility]. Patient has no further issues or need for intervention at this time. Patient is welcome to return if symptoms return.   Vonzella Nipple, PA-C 03/08/2023 10:28 AM

## 2023-03-10 ENCOUNTER — Encounter (HOSPITAL_COMMUNITY): Payer: Self-pay

## 2023-03-10 ENCOUNTER — Ambulatory Visit (HOSPITAL_COMMUNITY)
Admission: EM | Admit: 2023-03-10 | Discharge: 2023-03-10 | Disposition: A | Discharge status: Home / Self Care | Payer: Self-pay | Attending: Physician Assistant | Admitting: Physician Assistant

## 2023-03-10 DIAGNOSIS — N898 Other specified noninflammatory disorders of vagina: Secondary | ICD-10-CM

## 2023-03-10 LAB — POCT URINALYSIS DIP (MANUAL ENTRY)
Bilirubin, UA: NEGATIVE
Glucose, UA: NEGATIVE mg/dL
Ketones, POC UA: NEGATIVE mg/dL
Nitrite, UA: NEGATIVE
Protein Ur, POC: 30 mg/dL — AB
Spec Grav, UA: 1.015 (ref 1.010–1.025)
Urobilinogen, UA: 0.2 E.U./dL
pH, UA: >=9 — AB (ref 5.0–8.0)

## 2023-03-10 MED ORDER — FLUCONAZOLE 150 MG PO TABS: 150.0000 mg | ORAL_TABLET | Freq: Every day | ORAL | 0 refills | Status: AC

## 2023-03-10 NOTE — Discharge Instructions (Addendum)
Take diflucan as prescribed Will call with test results

## 2023-03-10 NOTE — ED Provider Notes (Signed)
[facility]    CSN: [id number] Arrival date & time: 03/10/23  1532      History   Chief Complaint Chief Complaint  Patient presents with   Vaginal Discharge    HPI Laurie [name] is a 42 y.o. female.   Patient presents with back discharge and itching that started several days ago.  She has tried Monistat with minimal relief.  She reports some dysuria.  She denies fever, chills, nausea, vomiting, = abdominal pain, flank pain, pelvic pain.     Past Medical History:  Diagnosis Date   Heart murmur    at birth    Patient Active Problem List   Diagnosis Date Noted   Closed displaced fracture of shaft of fifth metacarpal bone of right hand 11/22/2017   Displaced fracture of right ulna styloid process, initial encounter for closed fracture 11/22/2017   Cesarean delivery delivered 09/23/2012   Arrested active phase of labor 09/21/2012   Postmaturity pregnancy, 40-[redacted] weeks gestation 09/20/2012   Pediatric pre-birth visit for expectant parent(s) 08/01/2012    Past Surgical History:  Procedure Laterality Date   CESAREAN SECTION N/A 09/21/2012   Procedure: CESAREAN SECTION;  Surgeon: Antionette Char, MD;  Location: [facility];  Service: Obstetrics;  Laterality: N/A;    OB History     Gravida  2   Para  1   Term  1   Preterm  0   AB  1   Living  1      SAB  1   IAB  0   Ectopic  0   Multiple  0   Live Births  1            Home Medications    Prior to Admission medications   Medication Sig Start Date End Date Taking? Authorizing Provider  fluconazole (DIFLUCAN) 150 MG tablet Take 1 tablet (150 mg total) by mouth daily. 03/10/23  Yes Ward, Tylene Fantasia, PA-C  AUROVELA FE 1/20 1-20 MG-MCG tablet Take 1 tablet by mouth daily. 01/22/23   [provider]  HYDROcodone-acetaminophen (NORCO/VICODIN) 5-325 MG tablet Take 1 tablet by mouth every 4 (four) hours as needed. Patient not taking: Reported on 03/08/2023 11/10/17   Elson Areas,  PA-C  medroxyPROGESTERone (DEPO-PROVERA) 150 MG/ML injection Inject 1 mL (150 mg total) into the muscle every 3 (three) months. Patient not taking: Reported on 03/08/2023 11/15/12   Brock Bad, MD  Prenatal Vit-Fe Fumarate-FA (MULTIVITAMIN-PRENATAL) 27-0.8 MG TABS Take 1 tablet by mouth daily. Patient not taking: Reported on 03/08/2023    [provider]    Family History Family History  Problem Relation Age of Onset   Hypertension Mother    Hyperlipidemia Father    Hypertension Father    Anesthesia problems Neg Hx     Social History Social History   Tobacco Use   Smoking status: Every Day    Current packs/day: 0.25    Average packs/day: 0.3 packs/day for 1 year (0.3 ttl pk-yrs)    Types: Cigarettes   Smokeless tobacco: Never   Tobacco comments:    went from pack a day, to 1 a day with + preg  Substance Use Topics   Alcohol use: Yes    Comment: "sometimes"   Drug use: Yes    Types: Marijuana    Comment: none since Pos preg     Allergies   Patient has no known allergies.   Review of Systems Review of Systems  Constitutional:  Negative for  chills and fever.  HENT:  Negative for ear pain and sore throat.   Eyes:  Negative for pain and visual disturbance.  Respiratory:  Negative for cough and shortness of breath.   Cardiovascular:  Negative for chest pain and palpitations.  Gastrointestinal:  Negative for abdominal pain and vomiting.  Genitourinary:  Positive for vaginal discharge. Negative for dysuria and hematuria.  Musculoskeletal:  Negative for arthralgias and back pain.  Skin:  Negative for color change and rash.  Neurological:  Negative for seizures and syncope.  All other systems reviewed and are negative.    Physical Exam Triage Vital Signs ED Triage Vitals  Encounter Vitals Group     BP 03/10/23 1707 132/82     Systolic BP Percentile --      Diastolic BP Percentile --      Pulse Rate 03/10/23 1705 60     Resp 03/10/23 1705 16     Temp  03/10/23 1705 98.8 F (37.1 C)     Temp Source 03/10/23 1705 Oral     SpO2 03/10/23 1705 99 %     Weight --      Height --      Head Circumference --      Peak Flow --      Pain Score 03/10/23 1706 0     Pain Loc --      Pain Education --      Exclude from Growth Chart --    No data found.  Updated Vital Signs BP 132/82 (BP Location: Left Arm)   Pulse 60   Temp 98.8 F (37.1 C) (Oral)   Resp 16   LMP 03/10/2023 (Exact Date)   SpO2 99%   Visual Acuity Right Eye Distance:   Left Eye Distance:   Bilateral Distance:    Right Eye Near:   Left Eye Near:    Bilateral Near:     Physical Exam Vitals and nursing note reviewed.  Constitutional:      General: She is not in acute distress.    Appearance: She is well-developed.  HENT:     Head: Normocephalic and atraumatic.  Eyes:     Conjunctiva/sclera: Conjunctivae normal.  Cardiovascular:     Rate and Rhythm: Normal rate and regular rhythm.     Heart sounds: No murmur heard. Pulmonary:     Effort: Pulmonary effort is normal. No respiratory distress.     Breath sounds: Normal breath sounds.  Abdominal:     Palpations: Abdomen is soft.     Tenderness: There is no abdominal tenderness.  Musculoskeletal:        General: No swelling.     Cervical back: Neck supple.  Skin:    General: Skin is warm and dry.     Capillary Refill: Capillary refill takes less than 2 seconds.  Neurological:     Mental Status: She is alert.  Psychiatric:        Mood and Affect: Mood normal.      UC Treatments / Results  Labs (all labs ordered are listed, but only abnormal results are displayed) Labs Reviewed  POCT URINALYSIS DIP (MANUAL ENTRY) - Abnormal; Notable for the following components:      Result Value   Clarity, UA hazy (*)    Blood, UA large (*)    pH, UA >=9.0 (*)    Protein Ur, POC =30 (*)    Leukocytes, UA Large (3+) (*)    All other components within normal limits  URINE  CULTURE  CERVICOVAGINAL ANCILLARY ONLY     EKG   Radiology No results found.  Procedures Procedures (including critical care time)  Medications Ordered in UC Medications - No data to display  Initial Impression / Assessment and Plan / UC Course  I have reviewed the triage vital signs and the nursing notes.  Pertinent labs & imaging results that were available during my care of the patient were reviewed by me and considered in my medical decision making (see chart for details).     Will treat for vaginal yeast infection.  Cervicovaginal self swab in clinic today.  Will change treatment plan if indicated based on results. Pt reports mild dysuria, ua with large leukocytes and RBC.  Will send out urine culture and treat for UTI if indicated.  Final Clinical Impressions(s) / UC Diagnoses   Final diagnoses:  Vaginal itching     Discharge Instructions      Take diflucan as prescribed Will call with test results      ED Prescriptions     Medication Sig Dispense Auth. Provider   fluconazole (DIFLUCAN) 150 MG tablet Take 1 tablet (150 mg total) by mouth daily. 1 tablet Ward, Tylene Fantasia, PA-C      PDMP not reviewed this encounter.   Ward, Tylene Fantasia, PA-C 03/10/23 1815

## 2023-03-10 NOTE — ED Triage Notes (Signed)
Pt states vaginal discharge and itching for the past few days. States she tried a Chief of Staff at home with no relief.

## 2023-03-11 LAB — CERVICOVAGINAL ANCILLARY ONLY
Bacterial Vaginitis (gardnerella): NEGATIVE
Candida Glabrata: NEGATIVE
Candida Vaginitis: POSITIVE — AB
Chlamydia: NEGATIVE
Comment: NEGATIVE
Comment: NEGATIVE
Comment: NEGATIVE
Comment: NEGATIVE
Comment: NEGATIVE
Comment: NORMAL
Neisseria Gonorrhea: NEGATIVE
Trichomonas: NEGATIVE

## 2023-03-11 LAB — URINE CULTURE: Culture: NO GROWTH
